# Patient Record
Sex: Male | Born: 1970
Health system: Southern US, Community
[De-identification: ages and names within clinical notes are randomized; demographics above are authoritative.]

## PROBLEM LIST (undated history)

## (undated) DIAGNOSIS — I10 Essential (primary) hypertension: Secondary | ICD-10-CM

## (undated) DIAGNOSIS — D170 Benign lipomatous neoplasm of skin and subcutaneous tissue of head, face and neck: Secondary | ICD-10-CM

## (undated) DIAGNOSIS — E119 Type 2 diabetes mellitus without complications: Secondary | ICD-10-CM

## (undated) DIAGNOSIS — T7840XA Allergy, unspecified, initial encounter: Secondary | ICD-10-CM

## (undated) DIAGNOSIS — F1011 Alcohol abuse, in remission: Secondary | ICD-10-CM

## (undated) DIAGNOSIS — K859 Acute pancreatitis without necrosis or infection, unspecified: Secondary | ICD-10-CM

## (undated) DIAGNOSIS — R1310 Dysphagia, unspecified: Secondary | ICD-10-CM

## (undated) DIAGNOSIS — Z720 Tobacco use: Secondary | ICD-10-CM

## (undated) DIAGNOSIS — H409 Unspecified glaucoma: Secondary | ICD-10-CM

## (undated) HISTORY — DX: Alcohol abuse, in remission: F10.11

## (undated) HISTORY — DX: Unspecified glaucoma: H40.9

## (undated) HISTORY — DX: Benign lipomatous neoplasm of skin and subcutaneous tissue of head, face and neck: D17.0

## (undated) HISTORY — DX: Tobacco use: Z72.0

## (undated) HISTORY — DX: Allergy, unspecified, initial encounter: T78.40XA

## (undated) HISTORY — DX: Dysphagia, unspecified: R13.10

## (undated) HISTORY — DX: Type 2 diabetes mellitus without complications: E11.9

## (undated) HISTORY — DX: Essential (primary) hypertension: I10

## (undated) HISTORY — DX: Acute pancreatitis without necrosis or infection, unspecified: K85.90

---

## 2005-04-18 ENCOUNTER — Inpatient Hospital Stay: Payer: Self-pay | Admitting: Internal Medicine

## 2006-08-09 ENCOUNTER — Inpatient Hospital Stay: Payer: Self-pay | Admitting: Internal Medicine

## 2006-11-15 ENCOUNTER — Inpatient Hospital Stay: Payer: Self-pay | Admitting: Internal Medicine

## 2006-11-15 ENCOUNTER — Other Ambulatory Visit: Payer: Self-pay

## 2009-04-03 ENCOUNTER — Emergency Department: Payer: Self-pay | Admitting: Emergency Medicine

## 2009-07-27 ENCOUNTER — Emergency Department: Payer: Self-pay | Admitting: Emergency Medicine

## 2009-11-17 ENCOUNTER — Inpatient Hospital Stay: Payer: Self-pay | Admitting: Internal Medicine
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
## 2011-08-01 ENCOUNTER — Inpatient Hospital Stay: Payer: Self-pay | Admitting: Internal Medicine

## 2012-05-22 ENCOUNTER — Inpatient Hospital Stay: Payer: Self-pay | Admitting: Internal Medicine

## 2012-05-22 LAB — URINALYSIS, COMPLETE
Bacteria: NONE SEEN
Bilirubin,UR: NEGATIVE
Glucose,UR: >=500 mg/dL (ref 0–75)
WBC UR: 1 /HPF (ref 0–5)

## 2012-05-22 LAB — COMPREHENSIVE METABOLIC PANEL
Albumin: 3.7 g/dL (ref 3.4–5.0)
Anion Gap: 11 (ref 7–16)
Bilirubin,Total: 1.9 mg/dL — ABNORMAL HIGH (ref 0.2–1.0)
Calcium, Total: 8.9 mg/dL (ref 8.5–10.1)
Chloride: 88 mmol/L — ABNORMAL LOW (ref 98–107)
Co2: 28 mmol/L (ref 21–32)
Creatinine: 1.52 mg/dL — ABNORMAL HIGH (ref 0.60–1.30)
EGFR (Non-African Amer.): 56 — ABNORMAL LOW
Glucose: 431 mg/dL — ABNORMAL HIGH (ref 65–99)
Osmolality: 275 (ref 275–301)
Potassium: 4.6 mmol/L (ref 3.5–5.1)
SGOT(AST): 89 U/L — ABNORMAL HIGH (ref 15–37)
Sodium: 127 mmol/L — ABNORMAL LOW (ref 136–145)

## 2012-05-22 LAB — CBC
HCT: 52.1 % — ABNORMAL HIGH (ref 40.0–52.0)
HGB: 17.9 g/dL (ref 13.0–18.0)
RBC: 5.13 10*6/uL (ref 4.40–5.90)
RDW: 13.5 % (ref 11.5–14.5)
WBC: 6.4 10*3/uL (ref 3.8–10.6)

## 2012-05-22 LAB — LIPASE, BLOOD: Lipase: 2115 U/L — ABNORMAL HIGH (ref 73–393)

## 2012-05-23 LAB — LIPID PANEL
Cholesterol: 205 mg/dL — ABNORMAL HIGH (ref 0–200)
HDL Cholesterol: 21 mg/dL — ABNORMAL LOW (ref 40–60)

## 2012-05-23 LAB — COMPREHENSIVE METABOLIC PANEL
Alkaline Phosphatase: 83 U/L (ref 50–136)
BUN: 20 mg/dL — ABNORMAL HIGH (ref 7–18)
Bilirubin,Total: 3.1 mg/dL — ABNORMAL HIGH (ref 0.2–1.0)
Co2: 20 mmol/L — ABNORMAL LOW (ref 21–32)
Creatinine: 1.77 mg/dL — ABNORMAL HIGH (ref 0.60–1.30)
EGFR (African American): 54 — ABNORMAL LOW
EGFR (Non-African Amer.): 46 — ABNORMAL LOW
SGOT(AST): 64 U/L — ABNORMAL HIGH (ref 15–37)
SGPT (ALT): 71 U/L
Total Protein: 7.7 g/dL (ref 6.4–8.2)

## 2012-05-23 LAB — BASIC METABOLIC PANEL
Anion Gap: 7 (ref 7–16)
BUN: 18 mg/dL (ref 7–18)
Calcium, Total: 6.4 mg/dL — CL (ref 8.5–10.1)
Chloride: 97 mmol/L — ABNORMAL LOW (ref 98–107)
Osmolality: 271 (ref 275–301)
Potassium: 4.5 mmol/L (ref 3.5–5.1)
Sodium: 129 mmol/L — ABNORMAL LOW (ref 136–145)

## 2012-05-23 LAB — HEMOGLOBIN A1C: Hemoglobin A1C: 6.8 % — ABNORMAL HIGH (ref 4.2–6.3)

## 2012-05-23 LAB — MAGNESIUM: Magnesium: 1.6 mg/dL — ABNORMAL LOW

## 2012-05-24 LAB — COMPREHENSIVE METABOLIC PANEL
Albumin: 2.3 g/dL — ABNORMAL LOW (ref 3.4–5.0)
Anion Gap: 7 (ref 7–16)
BUN: 13 mg/dL (ref 7–18)
Bilirubin,Total: 2.4 mg/dL — ABNORMAL HIGH (ref 0.2–1.0)
Creatinine: 1.38 mg/dL — ABNORMAL HIGH (ref 0.60–1.30)
Glucose: 172 mg/dL — ABNORMAL HIGH (ref 65–99)
Potassium: 3.9 mmol/L (ref 3.5–5.1)
SGOT(AST): 41 U/L — ABNORMAL HIGH (ref 15–37)
SGPT (ALT): 41 U/L
Sodium: 130 mmol/L — ABNORMAL LOW (ref 136–145)
Total Protein: 6.4 g/dL (ref 6.4–8.2)

## 2012-05-24 LAB — CBC WITH DIFFERENTIAL/PLATELET
Basophil %: 0.1 %
Eosinophil %: 0.5 %
HGB: 15.4 g/dL (ref 13.0–18.0)
Lymphocyte #: 0.6 10*3/uL — ABNORMAL LOW (ref 1.0–3.6)
MCH: 34.6 pg — ABNORMAL HIGH (ref 26.0–34.0)
MCHC: 33.3 g/dL (ref 32.0–36.0)
MCV: 104 fL — ABNORMAL HIGH (ref 80–100)
Monocyte #: 0.5 x10 3/mm (ref 0.2–1.0)
Monocyte %: 8.1 %
Neutrophil %: 82.8 %
Platelet: 94 10*3/uL — ABNORMAL LOW (ref 150–440)
RBC: 4.46 10*6/uL (ref 4.40–5.90)
WBC: 6.8 10*3/uL (ref 3.8–10.6)

## 2012-05-24 LAB — MAGNESIUM: Magnesium: 2 mg/dL

## 2012-05-24 LAB — LIPASE, BLOOD: Lipase: 1482 U/L — ABNORMAL HIGH (ref 73–393)

## 2012-05-25 LAB — COMPREHENSIVE METABOLIC PANEL
Alkaline Phosphatase: 65 U/L (ref 50–136)
BUN: 9 mg/dL (ref 7–18)
Bilirubin,Total: 1.9 mg/dL — ABNORMAL HIGH (ref 0.2–1.0)
Calcium, Total: 6.9 mg/dL — CL (ref 8.5–10.1)
Co2: 23 mmol/L (ref 21–32)
Creatinine: 0.99 mg/dL (ref 0.60–1.30)
EGFR (Non-African Amer.): >60
Glucose: 95 mg/dL (ref 65–99)
SGPT (ALT): 34 U/L
Sodium: 133 mmol/L — ABNORMAL LOW (ref 136–145)
Total Protein: 6.8 g/dL (ref 6.4–8.2)

## 2012-05-25 LAB — LIPASE, BLOOD: Lipase: 605 U/L — ABNORMAL HIGH (ref 73–393)

## 2012-05-25 LAB — MAGNESIUM: Magnesium: 2.5 mg/dL — ABNORMAL HIGH

## 2012-05-26 LAB — PRO B NATRIURETIC PEPTIDE: B-Type Natriuretic Peptide: 454 pg/mL — ABNORMAL HIGH (ref 0–125)

## 2012-05-26 LAB — BASIC METABOLIC PANEL
Anion Gap: 9 (ref 7–16)
Chloride: 100 mmol/L (ref 98–107)
Co2: 23 mmol/L (ref 21–32)
EGFR (Non-African Amer.): >60
Glucose: 129 mg/dL — ABNORMAL HIGH (ref 65–99)
Osmolality: 265 (ref 275–301)
Potassium: 3.6 mmol/L (ref 3.5–5.1)

## 2012-05-27 LAB — LIPASE, BLOOD: Lipase: 441 U/L — ABNORMAL HIGH (ref 73–393)

## 2012-05-27 LAB — BASIC METABOLIC PANEL
Anion Gap: 8 (ref 7–16)
Calcium, Total: 8.6 mg/dL (ref 8.5–10.1)
Chloride: 98 mmol/L (ref 98–107)
Co2: 27 mmol/L (ref 21–32)
Creatinine: 0.88 mg/dL (ref 0.60–1.30)
EGFR (African American): >60
Glucose: 149 mg/dL — ABNORMAL HIGH (ref 65–99)
Osmolality: 268 (ref 275–301)
Potassium: 3.4 mmol/L — ABNORMAL LOW (ref 3.5–5.1)
Sodium: 133 mmol/L — ABNORMAL LOW (ref 136–145)

## 2012-05-27 LAB — CBC WITH DIFFERENTIAL/PLATELET
Basophil #: 0 10*3/uL (ref 0.0–0.1)
Eosinophil #: 0 10*3/uL (ref 0.0–0.7)
Eosinophil %: 0.5 %
Lymphocyte #: 0.7 10*3/uL — ABNORMAL LOW (ref 1.0–3.6)
Lymphocyte %: 9.8 %
MCH: 34.8 pg — ABNORMAL HIGH (ref 26.0–34.0)
MCHC: 34.6 g/dL (ref 32.0–36.0)
MCV: 101 fL — ABNORMAL HIGH (ref 80–100)
Monocyte #: 1.5 x10 3/mm — ABNORMAL HIGH (ref 0.2–1.0)
Platelet: 129 10*3/uL — ABNORMAL LOW (ref 150–440)
RDW: 13.3 % (ref 11.5–14.5)

## 2013-05-30 ENCOUNTER — Ambulatory Visit: Payer: Self-pay | Admitting: Surgery

## 2013-05-30 LAB — CBC WITH DIFFERENTIAL/PLATELET
Basophil #: 0.1 10*3/uL (ref 0.0–0.1)
Basophil %: 1.4 %
Eosinophil #: 0.2 10*3/uL (ref 0.0–0.7)
Eosinophil %: 3.3 %
HCT: 43.3 % (ref 40.0–52.0)
HGB: 15.1 g/dL (ref 13.0–18.0)
Lymphocyte #: 2 10*3/uL (ref 1.0–3.6)
Lymphocyte %: 34.5 %
MCV: 89 fL (ref 80–100)
Neutrophil #: 3 10*3/uL (ref 1.4–6.5)
Neutrophil %: 52.1 %
Platelet: 196 10*3/uL (ref 150–440)
RBC: 4.89 10*6/uL (ref 4.40–5.90)
WBC: 5.7 10*3/uL (ref 3.8–10.6)

## 2013-05-30 LAB — COMPREHENSIVE METABOLIC PANEL
Albumin: 3.6 g/dL (ref 3.4–5.0)
Alkaline Phosphatase: 105 U/L (ref 50–136)
BUN: 13 mg/dL (ref 7–18)
Bilirubin,Total: 0.5 mg/dL (ref 0.2–1.0)
Calcium, Total: 9.5 mg/dL (ref 8.5–10.1)
Chloride: 96 mmol/L — ABNORMAL LOW (ref 98–107)
Creatinine: 1.3 mg/dL (ref 0.60–1.30)
EGFR (African American): >60
EGFR (Non-African Amer.): >60
SGPT (ALT): 24 U/L (ref 12–78)
Sodium: 133 mmol/L — ABNORMAL LOW (ref 136–145)

## 2013-08-13 IMAGING — US ABDOMEN ULTRASOUND
1 series · 14 of 25 positions shown · non-contrast
Comparison: none

REASON FOR EXAM: acute pancreatitis, h/o gallbladder sludge
COMMENTS:

[Series 1: abdomen ultrasound · 0.42mm/px · 14 of 96 slices shown]
[im 1/96]
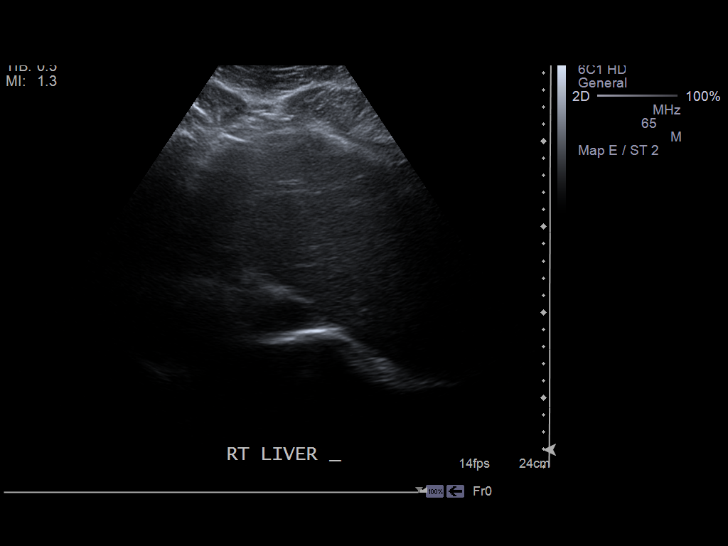
[im 8/96]
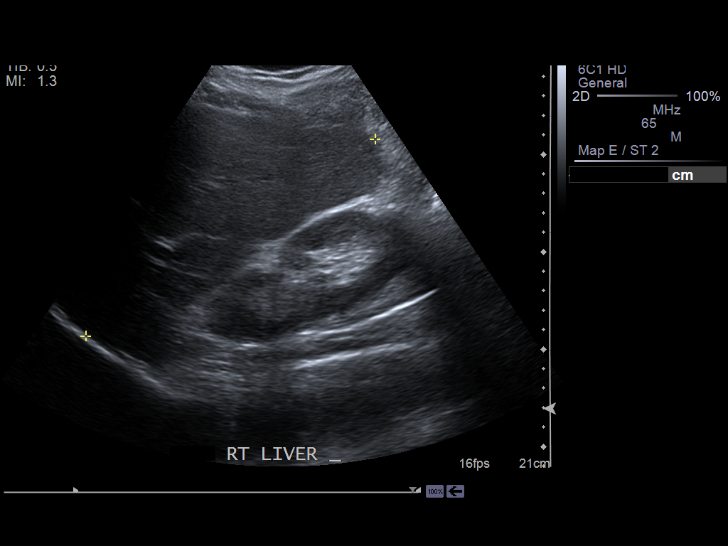
[im 16/96]
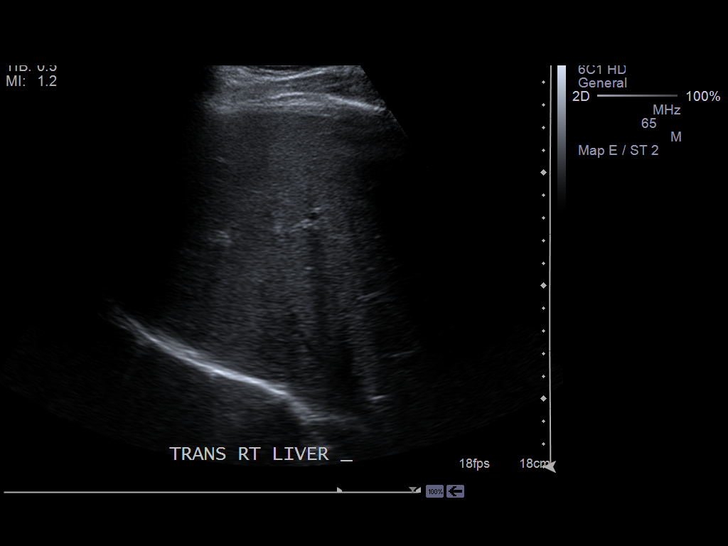
[im 24/96]
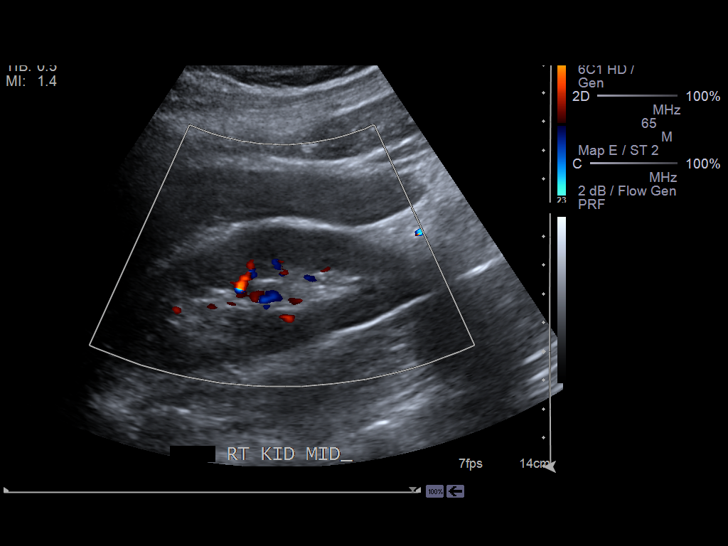
[im 32/96]
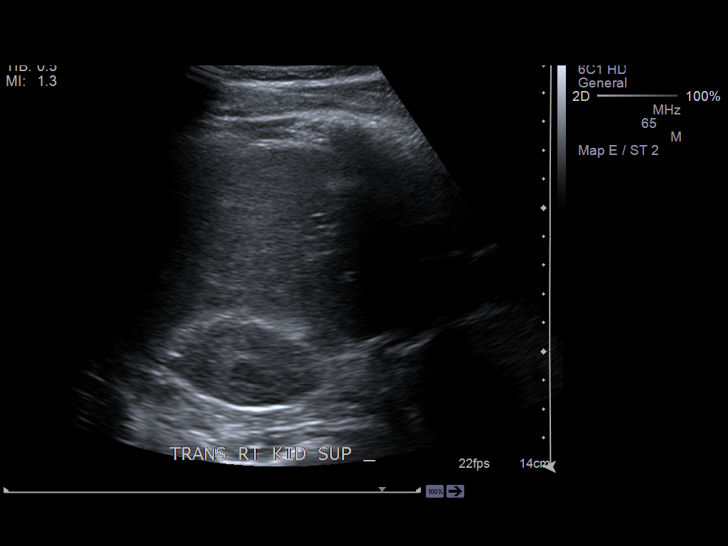
[im 36/96]
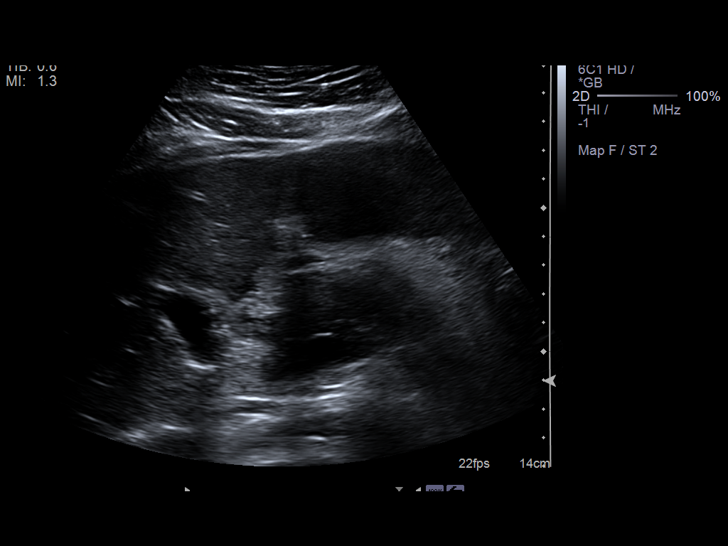
[im 44/96]
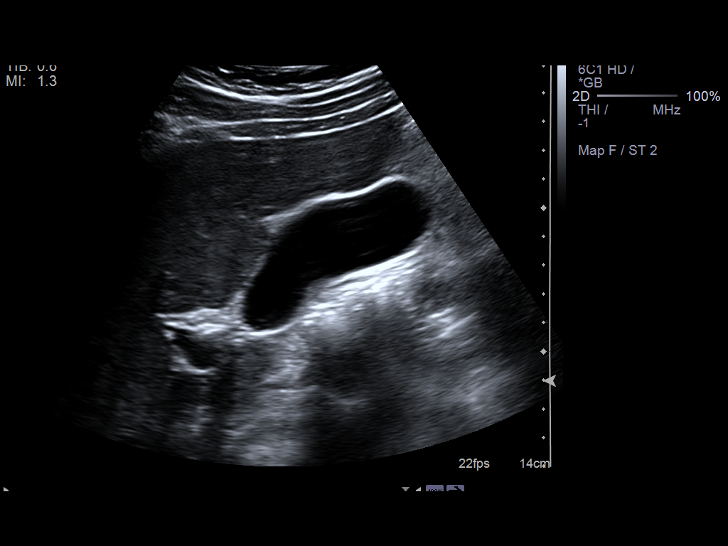
[im 52/96]
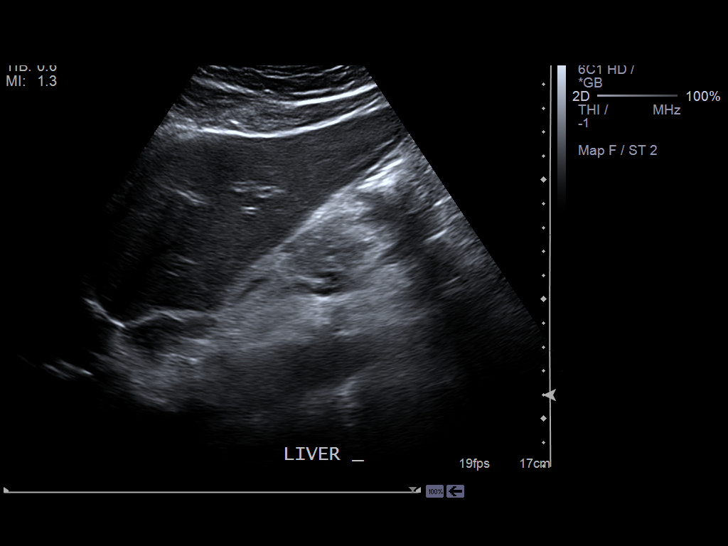
[im 60/96]
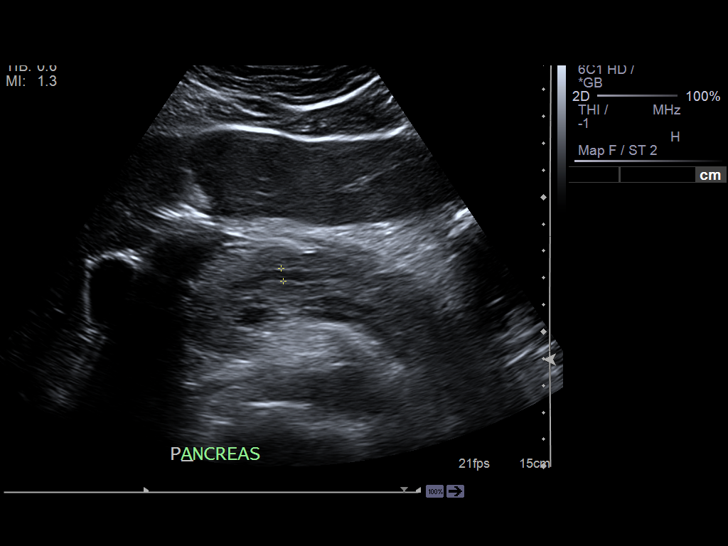
[im 64/96]
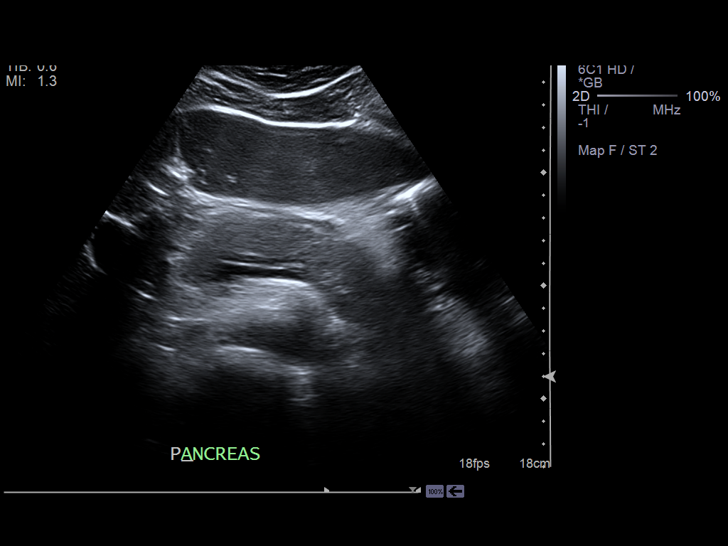
[im 72/96]
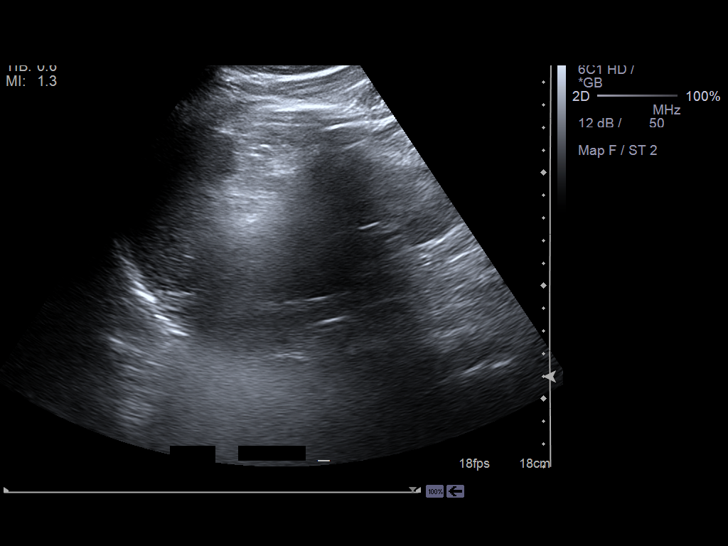
[im 80/96]
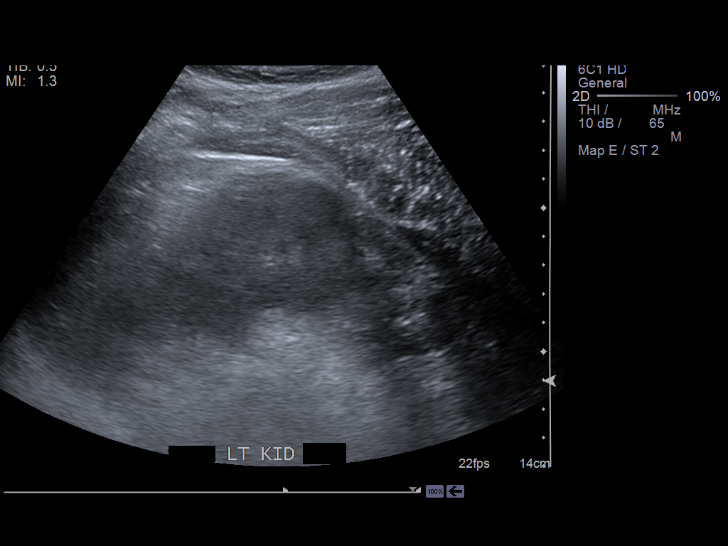
[im 88/96]
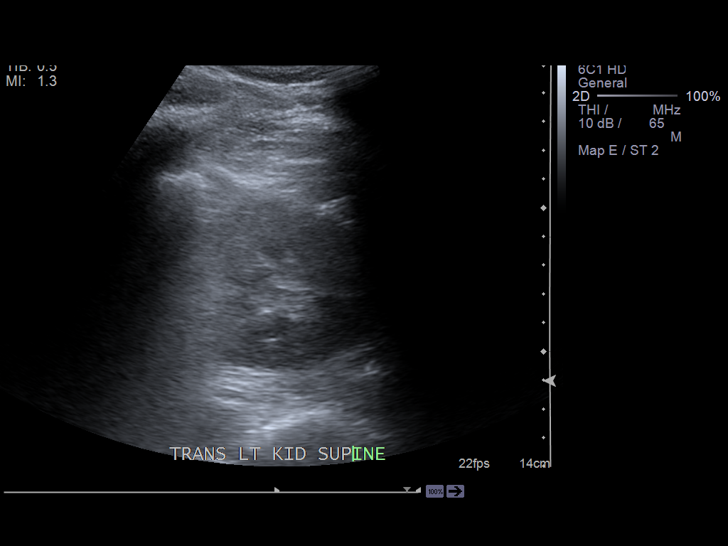
[im 96/96]
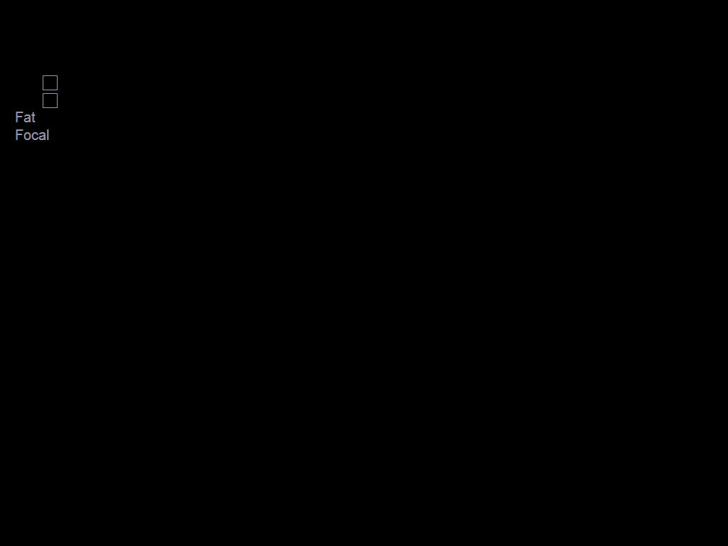

[14 of 25 positions shown; findings below may reference images not displayed]

PROCEDURE:     US  - US ABDOMEN GENERAL SURVEY  - May 22, 2012 [DATE]

RESULT:     The pancreas exhibits relatively normal contour and echotexture.
There is mild dilation of the pancreatic duct to approximately 5 mm. No
discrete pancreatic mass nor peripancreatic fluid is seen.

The liver exhibits normal echotexture with no focal mass nor ductal
dilation. Portal venous flow is normal in direction toward the liver. The
gallbladder is adequately distended with no evidence of stones, wall
thickening, or pericholecystic fluid. There is no positive sonographic
Murphy's sign. The common bile duct measures 3.7 mm in diameter. The spleen,
abdominal aorta, inferior vena cava, and kidneys are normal in appearance.
IMPRESSION: 1. There is mild pancreatic ductal dilation but no discrete pancreatic mass
or inflammatory changes seen. Mild pancreatic edema cannot be excluded.
2. The liver and gallbladder and spleen exhibit no acute abnormality.

## 2013-08-17 IMAGING — CR DG ABDOMEN 3V
1 series · 5 of 5 positions shown · non-contrast
Comparison: none

REASON FOR EXAM: abdominal distention,sob
COMMENTS:

PROCEDURE:     DXR - DXR ABDOMEN 3-WAY (INCL PA CXR)  - May 26, 2012 [DATE]
RESULT:     Comparisons:  None

[Series 1: pa · 0.17mm/px · 5 of 5 slices shown]
[im 1/5]
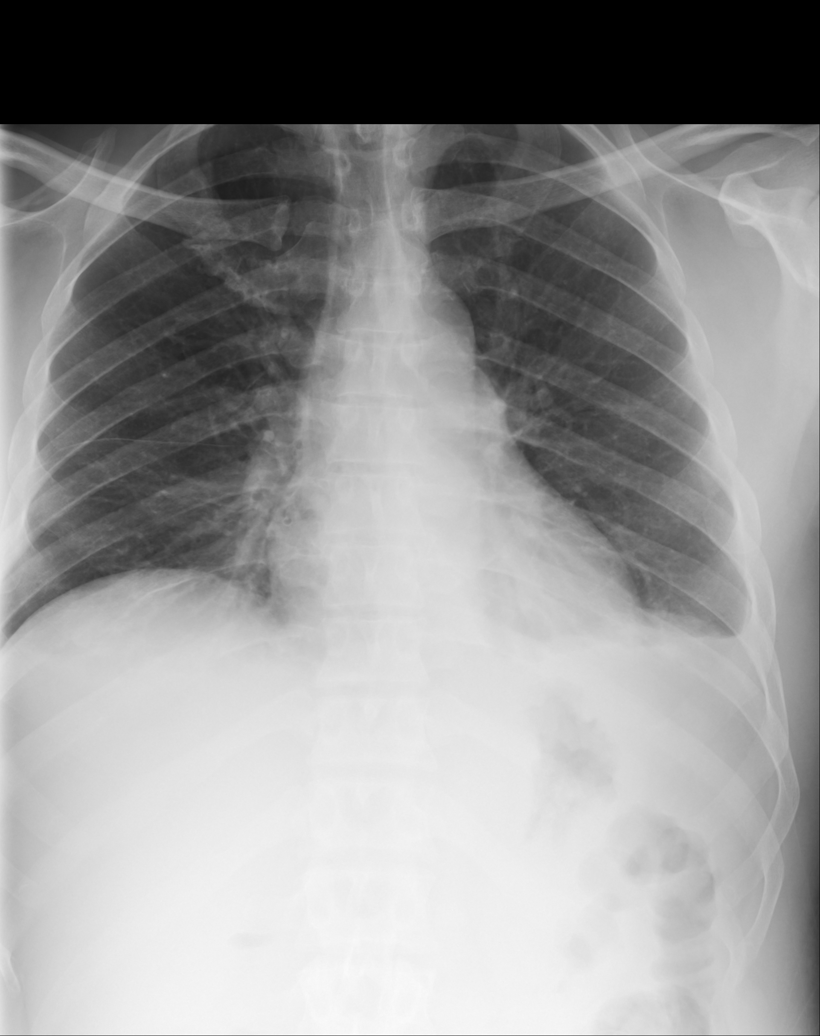
[im 2/5]
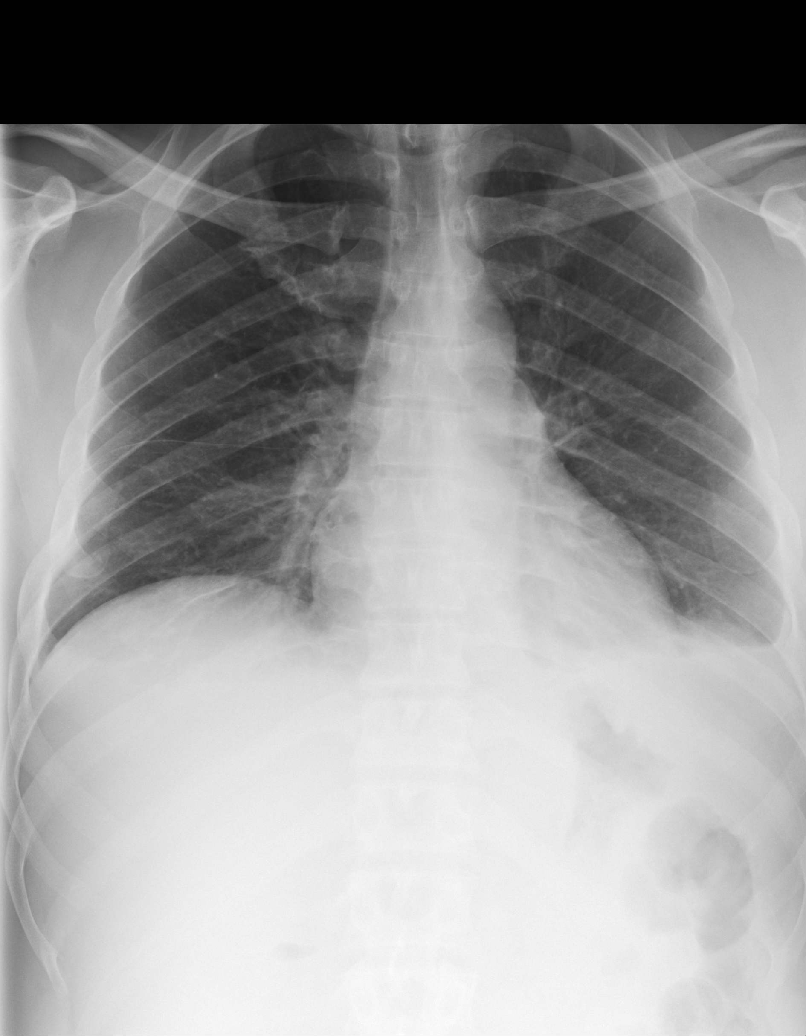
[im 3/5]
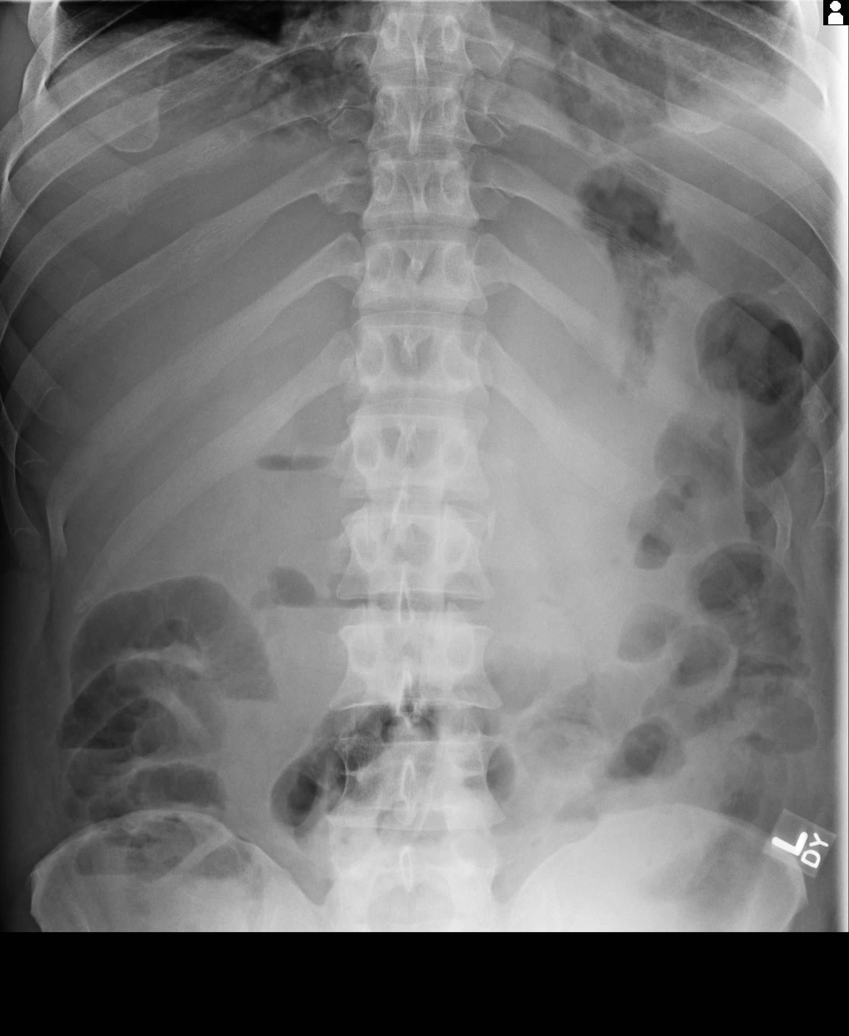
[im 4/5]
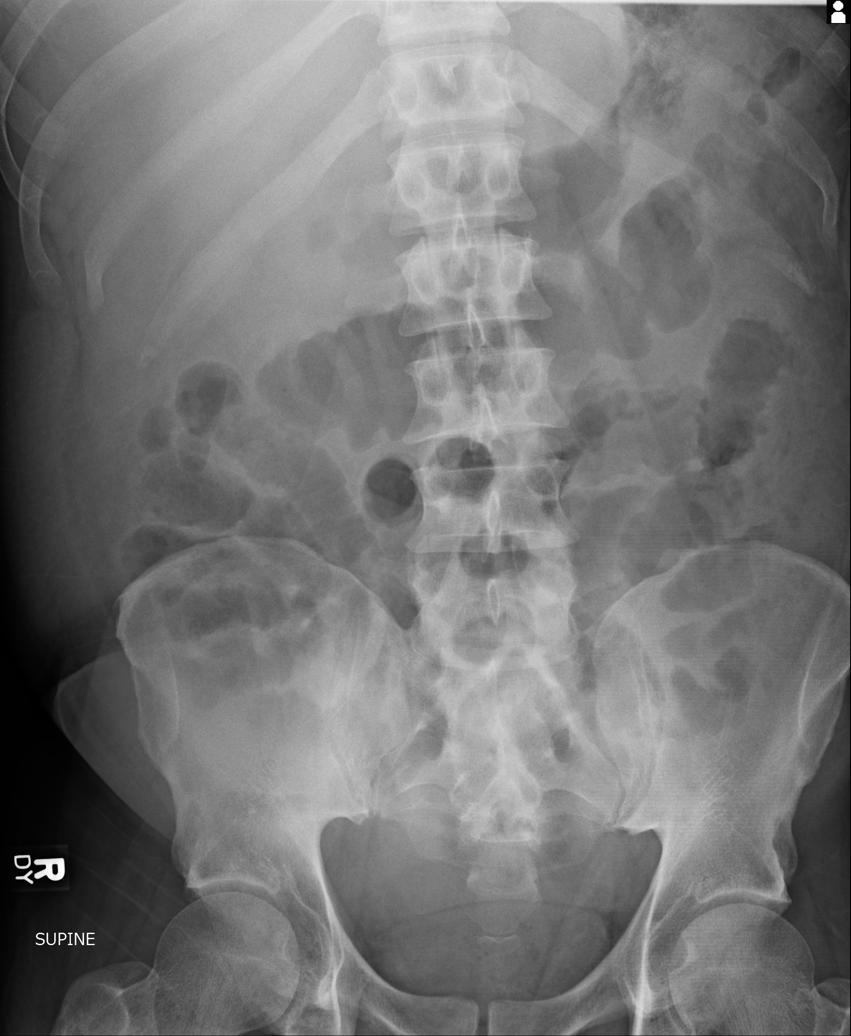
[im 5/5]
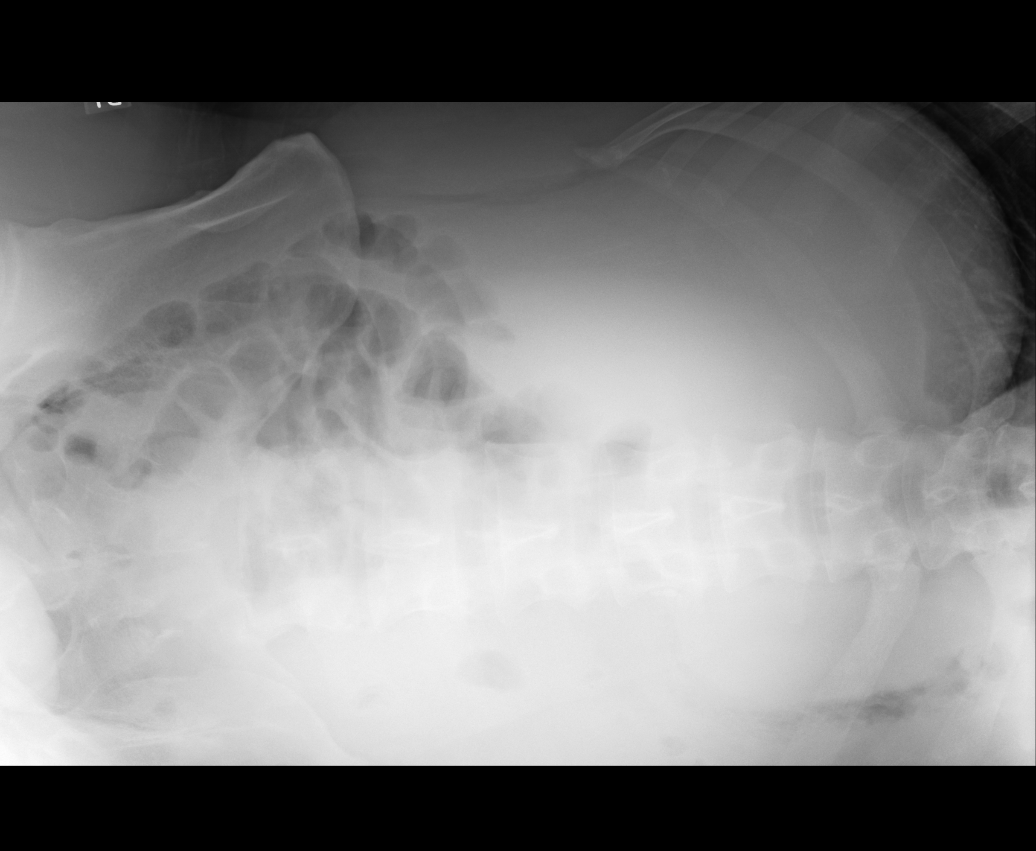

[5 of 5 positions shown; findings below may reference images not displayed]

FINDINGS: PA chest and supine, upright, and left lateral decubitus views of the
abdomen are provided.

There is a trace left pleural effusion. The heart and mediastinum are
unremarkable. The osseous structures are unremarkable.

There are a few small bowel air-fluid levels. There is no bowel dilatation.
Air is seen in the distal descending colon. There are no dilated loops,
bowel wall thickening, or evidence of mass effect.  Soft tissue shadows are
normal. There is no evidence of pneumoperitoneum, portal venous gas, or
pneumatosis.

Osseous structures are unremarkable.
IMPRESSION: There are a few small bowel air-fluid levels no bowel dilatation hand air
seen in the distal descending colon. This appearance can be seen with
enterocolitis secondary to an infectious or inflammatory etiology. Correlate
with clinical exam.

Trace left pleural effusion.

[REDACTED]

## 2015-03-15 NOTE — H&P (Signed)
PATIENT NAME:  Isaiah Stout, Isaiah Stout MR#:  [id number] DATE OF BIRTH:  18-Feb-1971  DATE OF ADMISSION:  05/22/2012  REFERRING PHYSICIAN: ER physician Dr. Prince Rome  PRIMARY CARE PHYSICIAN: [facility]  CHIEF COMPLAINT: Nausea, vomiting, abdominal pain.   HISTORY OF PRESENT ILLNESS: Patient is a 44 year old male with past medical history of alcohol-induced hepatitis, history of microlithiasis and gallstone sludge, diabetes, hypertension, hyperlipidemia not on any medications, ongoing alcohol and smoking who presented with sudden onset of nausea, vomiting and abdominal pain which started yesterday at about 5:00 p.m. The abdominal pain was initially generalized but is now more localized in the epigastric area. Patient denies any fever or diarrhea or constipation. He denies any cough, chest pain, shortness of breath. Patient was last admitted to [facility] in September 2012 with similar issues and at that time signed himself out [place].   ALLERGIES: No known drug allergies.   CURRENT MEDICATIONS: Lantus 60 units daily. Patient reports that he has not taken his Lantus because he ran out of the medication about two days ago.   PAST SURGICAL HISTORY: None.   PAST MEDICAL HISTORY:  1. History of recurrent alcoholic pancreatitis. Patient had an abdominal ultrasound in September 2012 which did show some gallbladder sludge. He was evaluated by Dr. Gustavo Lah at that time who felt that microlithiasis and gallbladder component may also be present in relation to his pancreatitis. Patient left AGAINST MEDICAL ADVICE before he could be evaluated further. 2. Elevated LFTs due to alcoholic hepatitis. 3. History of acute alcohol abuse.  4. History of acute renal failure, prerenal. 5. Diabetes. 6. History of dyslipidemia with elevated triglycerides. 7. History of hypertension. 8. Thrombocytopenia. 9. Ongoing tobacco and alcohol use. Patient reports that he last drank about four days ago.   FAMILY HISTORY:  Mother and father had diabetes.   SOCIAL HISTORY: Patient reports that he smokes 1 pack per day for the past several years. Patient reports that he drinks one 24-ounce beer every day. On the weekends he drinks a six-pack, 24 ounces beer and 4 to 5 shots of hard liquor. His last drink was about four days ago. He denies ever going into alcohol withdrawal.    REVIEW OF SYSTEMS: CONSTITUTIONAL: Denies any fever, fatigue, weakness. EYES: Denies any blurred or double vision. ENT: Denies any tinnitus, ear pain. RESPIRATORY: Denies any cough, wheezing. CARDIOVASCULAR: Denies any chest pain, palpitations. GASTROINTESTINAL: Reports nausea, vomiting, abdominal pain. Denies any diarrhea, constipation. GENITOURINARY: Denies any dysuria, hematuria. ENDOCRINE: Denies any polyuria, nocturia. HEME/LYMPH: Denies any anemia, easy bruisability. INTEGUMENT: Denies any acne, rash. MUSCULOSKELETAL: Denies any swelling, gout. NEUROLOGICAL: Denies any numbness, weakness. PSYCH: Denies any anxiety, depression.   PHYSICAL EXAMINATION:  VITAL SIGNS: Temperature 97.3, heart rate 68, respiratory rate 20, blood pressure 143/98, pulse ox 100%.   GENERAL: Patient is a 44 year old African American male laying comfortably in bed, not in acute distress. He is requesting for pain medications.   HEAD: Atraumatic, normocephalic.   EYES: There is no pallor, icterus, or cyanosis. Pupils equal, round, and reactive to light and accommodation. Extraocular movements intact.   ENT: Wet mucous membranes. No oropharyngeal erythema or thrush.   NECK: Supple. No masses. No JVD. No thyromegaly or lymphadenopathy.   CHEST WALL: No tenderness to palpation. Not using accessory muscles of respiration. No intercostal muscle retractions.   LUNGS: Bilaterally clear to auscultation. No wheezing, rales, or rhonchi.   CARDIOVASCULAR: S1, S2 regular. No murmur, rubs, or gallops.   ABDOMEN: Patient has generalized abdominal tenderness  more so in the  epigastric region. Hyperactive bowel sounds. No organomegaly. No guarding. No rigidity.   SKIN: No rashes or lesions.   PERIPHERIES: No pedal edema. 2+ pedal pulses.   MUSCULOSKELETAL: No cyanosis or clubbing.   NEUROLOGIC: Awake, alert, oriented x3. Nonfocal neurological exam. Cranial nerves grossly intact. No evidence of tremors.   PSYCH: Normal mood and affect.   LABORATORY, DIAGNOSTIC, AND RADIOLOGICAL DATA: Glucose 431, BUN 15, creatinine 1.52, sodium 127, potassium 4.6, chloride 8.88, CO2 28, calcium 8.9, bilirubin 1.9, alkaline phosphatase 92, ALT 142, white count 6.4, hemoglobin 17.9, hematocrit 52.1, platelet count 214, lipase 2115, amylase 186. Urinalysis shows evidence of infection.   ASSESSMENT AND PLAN: 44 year old male with past medical history of alcohol abuse, smoking, diabetes presents with nausea, vomiting, abdominal pain.  1. Acute pancreatitis, possibly related to patient's ongoing alcohol abuse. He has been admitted in the past for alcohol-induced pancreatitis. Abdominal ultrasound in 07/2011 also showed gallbladder sludge. Will admit the patient to the hospital. Keep him n.p.o. except medications and ice chips. Start him on IV fluid hydration, p.r.n. antiemetics and analgesics. Will recheck a lipase level in a.m. Will also check an abdominal ultrasound. Will check fasting lipid profile since patient has a history of hypertriglyceridemia in the past but is not on any treatment.   2. Uncontrolled diabetes. Patient reports that he ran out of his Lantus about 2 to 3 days ago. Will restart him on Lantus and place him on insulin sliding scale.  3. Acute renal failure, possibly due to poor oral intake. Will hydrate with IV fluids. Monitor strict ins and outs. Avoid nephrotoxic medications.  4. Hyponatremia possibly due to dehydration. There is also a possible component of beer potomania. Will hydrate patient with IV fluids. Monitor his sodium level closely.  5. Elevated LFTs with  elevated bilirubin, alkaline phosphatase and AST likely due to alcohol abuse. Will monitor closely.  6. History of hypertension. Patient is currently not on any medications. His blood pressure is mildly elevated currently possibly due to acute distress from pain. Will monitor closely and start on medications as needed to achieve good hypertensive control.  7. History of thrombocytopenia. Patient's platelet count is currently stable. 8. Smoking. Patient has been counseled about cessation for more than three minutes. Will provide a nicotine patch.  9. History of alcohol abuse. Patient reports that his last drink was more than four days ago. His alcohol level is not detectable on serum alcohol level. Will watch for alcohol withdrawal.   Reviewed old medical records, discussed with the patient and his wife the plan of care and management.   TIME SPENT: 75 minutes.   ____________________________ Cherre Huger, MD sp:cms D: 05/22/2012 11:08:56 ET T: 05/22/2012 11:49:06 ET JOB#: [id number]  cc: Cherre Huger, MD, <Dictator> [facility] Cherre Huger MD ELECTRONICALLY SIGNED 05/23/2012 7:09

## 2015-03-15 NOTE — Consult Note (Signed)
Pt feeling better, still with bloating, much less pain, had BM and passed gas today, did OK on clear liq this morning.  Lipase down to 600 from 3000 on admission.  I told him if he drinks alcohol he will be back with same problem.  Usually alcoholics have trouble sleeping so i recommend something to help with that when he leaves the hospital, may need some pain reliever such as tramadol or vicodin.  No new suggestions.  Dr. Candace Cruise on call for me over weekend but will not see unless you call about a problem.  Electronic Signatures: Manya Silvas (MD)  (Signed on 05-Jul-13 12:16)  Authored  Last Updated: 05-Jul-13 12:16 by Manya Silvas (MD)

## 2015-03-15 NOTE — Consult Note (Signed)
PATIENT NAME:  Isaiah Stout, Isaiah Stout MR#:  [id number] DATE OF BIRTH:  1971-04-09  DATE OF CONSULTATION:  05/23/2012  REFERRING PHYSICIAN:   CONSULTING PHYSICIAN:  Manya Silvas, MD  HISTORY OF PRESENT ILLNESS: Patient is a 44 year old black male who has a history of alcohol-induced pancreatitis. He had been admitted for this problem before and left AGAINST MEDICAL ADVICE after a few days. He currently comes in with epigastric abdominal pain across the abdomen radiating some into the back. He did have some vomiting and heaves. Denied any hematemesis. Patient has been plugged into IV fluids with hydration and morphine for pain. He says the morphine is controlling his pain. I was asked to see him in consultation.   Patient had an ultrasound and lab work raising suspicion for possible gallstone contributing to his problem. He had an M.R.C.P. today that showed no evidence of common bile duct obstruction from any stones or tumors. There was fullness of the pancreas consistent with acute pancreatitis.   Patient admits to smoking a pack a day. He drinks beer during the week and on the weekend beer and hard liquor.   Patient has a history of diabetes, insulin-dependent diabetes, for about six years. He thinks his first spell of pancreatitis occurred three or four years ago.   MEDICATION:  Lantus insulin 60 units a day. He had been out of his insulin for a few days.   ALLERGIES: No known drug allergies.   FAMILY HISTORY: Both parents died of lung disease; father at age 86, mother at age 50, both were smokers.   OTHER PAST MEDICAL PROBLEMS:  1. History of alcohol pancreatitis.  2. History of dyslipidemia with elevated triglycerides.  3. Hypertension.  4. Renal insufficiency.  5. Diabetes.  REVIEW OF SYSTEMS: Denies any hematemesis. No diarrhea. No rectal bleeding. No chest pains. He does have some trouble taking a deep breath because of discomfort in his abdomen.   PHYSICAL EXAMINATION:  GENERAL:  Black male in no acute distress, appropriate historian.   VITAL SIGNS: Temperature 98.7, pulse 128, blood pressure 166/78, pulse oximetry 95%.   EYES: Sclerae nonicteric. Conjunctivae negative.   HEAD: Atraumatic.   CHEST: Clear.   HEART: Tachycardia. No murmurs or gallops I can hear.   ABDOMEN: Distended and tight. Bowel sounds are present but diminished. No hepatosplenomegaly. No masses. No bruits but it is difficult to tell for sure because of his tight distention. There is tenderness across the upper abdomen, worse in the epigastric area.   LABORATORY, DIAGNOSTIC AND RADIOLOGICAL DATA: Glucose 278, BUN 18, creatinine 1.65, sodium 129, potassium 4.5, chloride 97, CO2 25, calcium 6.4, magnesium 1.6, triglycerides 814, HDL 21, lipase greater than 3000, hemoglobin A1c 6.8, total protein 7.7, albumin 2.8, total bilirubin 3.1, alkaline phosphatase 83, SGOT 64, SGPT 71, white count 6.4, hemoglobin 17.9 on admission, no repeat today yet, hematocrit 52.1, platelet count 214. Urinalysis significant for elevated glucose and protein.   ASSESSMENT: Alcohol pancreatitis. No evidence of biliary stone contribution.   RECOMMENDATIONS: IV fluids gently up to 4 or 5 liters in the first 24 hours. Continue morphine for pain relief. Insulin for his diabetes. Consider antilipid therapy if patient will take it as an outpatient. I talked with the patient about alcohol affecting the pancreas and about pancreatic enzymes causing pain and difficulty breathing. Will recommend he be started on incentive spirometer to prevent atelectasis and possible pneumonia.  ____________________________ Manya Silvas, MD rte:cms D: 05/23/2012 16:54:40 ET T: 05/23/2012 17:22:49 ET  JOB#: [id number]  cc: Manya Silvas, MD, <Dictator> Manya Silvas MD ELECTRONICALLY SIGNED 06/05/2012 17:33

## 2015-03-15 NOTE — Discharge Summary (Signed)
PATIENT NAME:  Isaiah Stout, Isaiah Stout MR#:  [id number] DATE OF BIRTH:  February 25, 1971  DATE OF ADMISSION:  05/22/2012 DATE OF DISCHARGE:  05/27/2012  ADMITTING DIAGNOSIS: Abdominal pain, nausea, vomiting.   DISCHARGE DIAGNOSES:  1. Nausea, vomiting, abdominal pain due to acute pancreatitis.  2. Acute pancreatitis felt to be due to hypertriglyceridemia as well as alcohol abuse. Lipase is still slightly elevated. The patient is without any abdominal symptoms.  3. Abdominal distention with nonspecific gas pattern, likely ileus related to acute pancreatitis.  4. Hypertriglyceridemia. The patient will be treated with TriCor.  5. Alcohol abuse. The patient was counseled regarding importance of stopping to drink.  6. Hypocalcemia possibly due to acute electrolyte imbalances, status post treatment with calcium chloride.  7. Possible sepsis with acute renal failure with acidosis. There was no evidence of infection and was felt to be due to pancreatitis. He was on antibiotics which were discontinued.  8. Hyperkalemia felt to be due to acute renal failure, resolved. Subsequently was hypokalemic which was replaced.  9. Poorly controlled diabetes.  10. Acute renal failure due to nausea and vomiting. Renal function normal.  11. Hyponatremia due to dehydration.  12. Elevated LFTs with elevated bilirubin likely due to alcohol abuse.  13. Hypertension.  14. History of thrombocytopenia.  15. Ongoing tobacco abuse.   PERTINENT LABS AND EVALUATIONS: Glucose 431, BUN 15, creatinine 1.52, sodium 127, potassium 4.6, CO2 28, calcium 8.9, bilirubin total 1.9, alkaline phosphatase 92, ALT 142, WBC count 6.4, hemoglobin 17.9, platelet count 214. Lipase 2115. Amylase 186. Urinalysis was negative. Lipid panel showed cholesterol of 205, triglycerides 814, HDL 21. His hemoglobin A1c was 6.8. His most recent creatinine was normal on July 5th.   Abdominal x-ray showed diffuse small bowel air fluid levels. No bowel dilation.  MRA  showed unremarkable MRCP. Enlargement of the head and uncinate process of the pancreas likely secondary to pancreatitis.   CONSULTANT: GI, Dr. Lamar Benes COURSE: The patient is a 44 year old African American male with past medical history of alcohol-induced hepatitis, history of gallstone sludge, diabetes, hypertension, and hyperlipidemia not on any medications. He was taking insulin but states that he ran out of the insulin, went to the clinic and was not able to pick that up. He presented with abdominal pain. He also has ongoing alcohol abuse. He was noted to be in acute pancreatitis. It was felt to be likely due to combination of his alcohol abuse and hypertriglyceridemia. The patient was kept n.p.o. He was seen by GI. Conservative management was done with resolution of his symptoms, although his lipase continues to be slightly elevated possibly due to chronic pancreatitis. The patient was also seen by GI. Due to his previous history of gallbladder sludge, he had a repeat ultrasound of the abdomen. He also underwent MRCP which showed no evidence of common bile duct obstruction. The patient also was noted to have acute renal failure that resolved with treatment. His diabetes is better controlled with the current insulin. His blood pressure was also poorly controlled. He was started on atenolol. Currently he is doing much better and is stable for discharge.   DISCHARGE MEDICATIONS:  1. Lantus 60 units sub-Q at bedtime.  2. TriCor 145 p.o. daily.  3. Atenolol 100 p.o. daily.   DIET: ADA, low fat diet.   ACTIVITY: As tolerated.   TIMEFRAME FOR FOLLOW-UP: 1 to 2 weeks at [facility].   The patient is told he needs to take his insulin as scheduled and meds  as prescribed. He is recommended not to drink alcohol. The patient will be supplied with seven day supply of medications from the hospital.       TIME SPENT: 35 minutes.   ____________________________ Lafonda Mosses Posey Pronto,  MD shp:drc D: 05/27/2012 12:17:22 ET T: 05/29/2012 10:11:42 ET JOB#: [id number]  cc: Ellee Wawrzyniak H. Posey Pronto, MD, <Dictator> [facility] Alric Seton MD ELECTRONICALLY SIGNED 06/01/2012 17:50

## 2015-03-15 NOTE — Consult Note (Signed)
Chief Complaint:   Subjective/Chief Complaint Feels better but still c/o abdominal distension and some discomfort. No vomiting. No BM and has not passed gas.   VITAL SIGNS/ANCILLARY NOTES: **Vital Signs.:   04-Jul-13 08:45   Vital Signs Type Routine   Temperature Temperature (F) 99.2   Celsius 37.3   Pulse Pulse 107   Respirations Respirations 18   Systolic BP Systolic BP 782   Diastolic BP (mmHg) Diastolic BP (mmHg) 89   Mean BP 106   Pulse Ox % Pulse Ox % 94   Pulse Ox Activity Level  At rest   Oxygen Delivery Room Air/ 21 %   Brief Assessment:   Additional Physical Exam Abdomen is distended and somewhat tense. Bowel sounds are very sluggish.   Lab Results: Hepatic:  04-Jul-13 04:49    Bilirubin, Total  2.4   Alkaline Phosphatase 62   SGPT (ALT) 41 (12-78 NOTE: NEW REFERENCE RANGE 10/14/2011)   SGOT (AST)  41   Total Protein, Serum 6.4   Albumin, Serum  2.3  Routine Chem:  04-Jul-13 04:49    Glucose, Serum  172   BUN 13   Creatinine (comp)  1.38   Sodium, Serum  130   Potassium, Serum 3.9   Chloride, Serum  97   CO2, Serum 26   Calcium (Total), Serum  6.7   Osmolality (calc) 265   eGFR (African American) >60   eGFR (Non-African American) >60 (eGFR values <79m/min/1.73 m2 may be an indication of chronic kidney disease (CKD). Calculated eGFR is useful in patients with stable renal function. The eGFR calculation will not be reliable in acutely ill patients when serum creatinine is changing rapidly. It is not useful in  patients on dialysis. The eGFR calculation may not be applicable to patients at the low and high extremes of body sizes, pregnant women, and vegetarians.)   Result Comment calcium - RESULTS VERIFIED BY REPEAT TESTING.  - NOTIFIED OF CRITICAL VALUE  - mpg c/ silvia fuentes @ 0617 05/24/12  - READ-BACK PROCESS PERFORMED.  Result(s) reported on 24 May 2012 at 06:01AM.   Anion Gap 7   Magnesium, Serum 2.0 (1.8-2.4 THERAPEUTIC RANGE: 4-7  mg/dL TOXIC: > 10 mg/dL  -----------------------)   Lipase  1482 (Result(s) reported on 24 May 2012 at 06:15AM.)  Routine Hem:  04-Jul-13 04:49    WBC (CBC) 6.8   RBC (CBC) 4.46   Hemoglobin (CBC) 15.4   Hematocrit (CBC) 46.3   Platelet Count (CBC)  94   MCV  104   MCH  34.6   MCHC 33.3   RDW 13.7   Neutrophil % 82.8   Lymphocyte % 8.5   Monocyte % 8.1   Eosinophil % 0.5   Basophil % 0.1   Neutrophil # 5.6   Lymphocyte #  0.6   Monocyte # 0.5   Eosinophil # 0.0   Basophil # 0.0 (Result(s) reported on 24 May 2012 at 06:01AM.)   Assessment/Plan:  Assessment/Plan:   Assessment Acute pancreatitis. Lipase is now around 1400 (was > 3000). Probable ileus. No evidence of choledocholithiasis. Bilirubin is improving.    Plan Continue supportive care. I will not start clear liquids now due to suspected ileus.  Sit in chair frequently and walk if possible. Dr. EVira Agarwill resume care in am.   Electronic Signatures: IJill Side(MD)  (Signed 04-Jul-13 10:59)  Authored: Chief Complaint, VITAL SIGNS/ANCILLARY NOTES, Brief Assessment, Lab Results, Assessment/Plan   Last Updated: 04-Jul-13 10:59 by IJill Side(MD)

## 2015-03-15 NOTE — Consult Note (Signed)
Pt with alcohol pancreatitis, MRCP showed no sign of CBD blockage.  Will start incentive spirometer to try to prevent atelectasis.  Pt wants to try to get out of hospital so can go back to work Monday.  Electronic Signatures: Manya Silvas (MD)  (Signed on 03-Jul-13 16:56)  Authored  Last Updated: 03-Jul-13 16:56 by Manya Silvas (MD)

## 2015-04-13 ENCOUNTER — Other Ambulatory Visit: Payer: Self-pay | Admitting: Family Medicine

## 2015-04-13 ENCOUNTER — Ambulatory Visit
Admission: RE | Admit: 2015-04-13 | Discharge: 2015-04-13 | Disposition: A | Discharge status: Home / Self Care | Payer: BLUE CROSS/BLUE SHIELD | Source: Ambulatory Visit | Attending: Family Medicine | Admitting: Family Medicine

## 2015-04-13 DIAGNOSIS — M545 Low back pain: Secondary | ICD-10-CM | POA: Diagnosis present

## 2015-04-13 DIAGNOSIS — M79606 Pain in leg, unspecified: Secondary | ICD-10-CM | POA: Diagnosis present

## 2015-04-13 DIAGNOSIS — M79605 Pain in left leg: Secondary | ICD-10-CM | POA: Insufficient documentation

## 2015-05-09 ENCOUNTER — Other Ambulatory Visit: Payer: Self-pay | Admitting: Family Medicine

## 2015-05-21 ENCOUNTER — Telehealth: Payer: Self-pay | Admitting: Family Medicine

## 2015-05-21 NOTE — Telephone Encounter (Signed)
Patient is checking status on refill for Omeprazole. Please send to [email or web address]. It is needing authorization.

## 2015-05-23 ENCOUNTER — Other Ambulatory Visit: Payer: Self-pay | Admitting: Family Medicine

## 2015-05-27 ENCOUNTER — Other Ambulatory Visit: Payer: Self-pay | Admitting: Family Medicine

## 2015-06-04 NOTE — Telephone Encounter (Signed)
Tried to place prior auth for medication on [email or web address], stated no coverage, called [employer] and they're updating their computer system therefore I'll have to call back in an hr or two.

## 2015-06-09 NOTE — Telephone Encounter (Signed)
Called [employer] this morning and after being on the phone for 40 mins, I finally spoke with someone who faxed a prior auth form to me to be filled waiting out, waiting on signature from Dr. Then will fax to [facility] to determine if they'll approve or deny the medication.

## 2015-06-10 NOTE — Telephone Encounter (Signed)
I have faxed the prior auth for omeprazole to [employer], waiting on a determination. Once I have receive that I'll notify the patient of the outcome

## 2015-07-08 ENCOUNTER — Other Ambulatory Visit: Payer: Self-pay | Admitting: Family Medicine

## 2015-08-08 ENCOUNTER — Other Ambulatory Visit: Payer: Self-pay | Admitting: Family Medicine

## 2015-08-17 ENCOUNTER — Ambulatory Visit: Payer: Self-pay | Admitting: Family Medicine

## 2015-08-26 ENCOUNTER — Ambulatory Visit: Payer: Self-pay | Admitting: Family Medicine

## 2015-09-02 ENCOUNTER — Ambulatory Visit (INDEPENDENT_AMBULATORY_CARE_PROVIDER_SITE_OTHER): Payer: 59 | Admitting: Family Medicine

## 2015-09-02 ENCOUNTER — Encounter: Payer: Self-pay | Admitting: Family Medicine

## 2015-09-02 VITALS — BP 132/80 | HR 67 | Temp 97.6°F | Resp 16 | Ht 71.0 in | Wt 186.5 lb

## 2015-09-02 DIAGNOSIS — E109 Type 1 diabetes mellitus without complications: Secondary | ICD-10-CM | POA: Diagnosis not present

## 2015-09-02 DIAGNOSIS — I10 Essential (primary) hypertension: Secondary | ICD-10-CM

## 2015-09-02 DIAGNOSIS — Z125 Encounter for screening for malignant neoplasm of prostate: Secondary | ICD-10-CM

## 2015-09-02 DIAGNOSIS — IMO0001 Reserved for inherently not codable concepts without codable children: Secondary | ICD-10-CM

## 2015-09-02 DIAGNOSIS — Z23 Encounter for immunization: Secondary | ICD-10-CM | POA: Diagnosis not present

## 2015-09-02 DIAGNOSIS — E1065 Type 1 diabetes mellitus with hyperglycemia: Principal | ICD-10-CM

## 2015-09-02 DIAGNOSIS — E785 Hyperlipidemia, unspecified: Secondary | ICD-10-CM | POA: Diagnosis not present

## 2015-09-02 LAB — POCT GLYCOSYLATED HEMOGLOBIN (HGB A1C): Hemoglobin A1C: 12.5

## 2015-09-02 LAB — POCT UA - MICROALBUMIN: Microalbumin Ur, POC: NEGATIVE mg/L

## 2015-09-02 NOTE — Patient Instructions (Signed)
Referred to endocrinologist. 

## 2015-09-02 NOTE — Progress Notes (Signed)
4 6460 mg Name: Isaiah Stout.   MRN: [id number]    DOB: Aug 06, 1971   Date:09/02/2015       Progress Note  Subjective  Chief Complaint  Chief Complaint  Patient presents with  . Diabetes    Patient is here for a 60-month follow-up. Patient states that things have been going well. He checks his blood sugar once a day. The highest it has been is 220 and the lowest has been 153.  . Medication Refill    Patient takes all medications as directed.    HPI  Diabetes  Patient presents for follow-up of diabetes which is present for over 5 years. Is currently on a regimen of Levemir 46 units twice a day and Apidra 16 units 3 times a day . Patient states he is compliant with their diet and exercise. There's been no hypoglycemic episodes and there is no polyuria polydipsia polyphagia. His average fasting glucoses been in the low around 153   with a high around 220 . There is no end organ disease.  Last diabetic eye exam was over 1 year ago.   Last visit with dietitian was over 1 year ago. Last microalbumin was obtained 0 on 04/04/2015 .   Hypertension   Patient presents for follow-up of hypertension. It has been present for over over 5 years.  Patient states that there is compliance with medical regimen which consists of diltiazem 180 mg daily . There is no end organ disease. Cardiac risk factors include hypertension hyperlipidemia and diabetes.  Exercise regimen consist of walking .  Diet consist of ADA .  Hyperlipidemia  Patient has a history of hyperlipidemia for over 5 years.  Current medical regimen consist of atorvastatin 40 mg daily at bedtime .  Compliance is good .  Diet and exercise are currently followed well, uncontrolled diabetes .  Risk factors for cardiovascular disease include hyperlipidemia , tobacco abuse uncontrolled diabetes .   There have been no side effects from the medication.    Past Medical History  Diagnosis Date  . Pancreatitis   . Diabetes mellitus without  complication ([place])   . Hypertension   . Allergy   . Dysphagia   . Lipoma of forehead   . H/O alcohol abuse     Social History  Substance Use Topics  . Smoking status: Current Every Day Smoker -- 1.00 packs/day for 12 years    Types: Cigarettes  . Smokeless tobacco: Not on file     Comment: patient states that he has been trying.  . Alcohol Use: No     Current outpatient prescriptions:  .  atorvastatin (LIPITOR) 40 MG tablet, Take 40 mg by mouth at bedtime., Disp: , Rfl: 5 .  diltiazem (CARDIZEM CD) 180 MG 24 hr capsule, Take 180 mg by mouth daily., Disp: , Rfl: 5 .  fluticasone (FLONASE) 50 MCG/ACT nasal spray, USE 1 SPRAY IN EACH NOSTRIL 2 TIMES DAILY, Disp: 1 g, Rfl: 12 .  LEVEMIR FLEXTOUCH 100 UNIT/ML Pen, INJECT 45 UNITS IN AM 45 UNITS IN PM SUBQ-INCREASE BY 1 UNIT IF FASTING BLOOD SUGAR IS >THAN 120, Disp: 3 pen, Rfl: 7 .  meloxicam (MOBIC) 15 MG tablet, TAKE 1 TABLET BY MOUTH EVERY DAY, Disp: 30 tablet, Rfl: 2 .  omeprazole (PRILOSEC) 40 MG capsule, TAKE 1 CAPSULE BY MOUTH DAILY, Disp: 30 capsule, Rfl: 5  No Known Allergies  Review of Systems  Constitutional: Negative for fever, chills and weight loss.  HENT: Negative for congestion,  hearing loss, sore throat and tinnitus.   Eyes: Negative for blurred vision, double vision and redness.  Respiratory: Negative for cough, hemoptysis and shortness of breath.   Cardiovascular: Negative for chest pain, palpitations, orthopnea, claudication and leg swelling.  Gastrointestinal: Negative for heartburn, nausea, vomiting, diarrhea, constipation and blood in stool.  Genitourinary: Negative for dysuria, urgency, frequency and hematuria.  Musculoskeletal: Negative for myalgias, back pain, joint pain, falls and neck pain.  Skin: Negative for itching.  Neurological: Negative for dizziness, tingling, tremors, focal weakness, seizures, loss of consciousness, weakness and headaches.  Endo/Heme/Allergies: Does not bruise/bleed easily.   Psychiatric/Behavioral: Negative for depression and substance abuse. The patient is not nervous/anxious and does not have insomnia.      Objective  Filed Vitals:   09/02/15 0800  BP: 132/80  Pulse: 67  Temp: 97.6 F (36.4 C)  TempSrc: Oral  Resp: 16  Height: 5\' 11"  (1.803 m)  Weight: 186 lb 8 oz (84.596 kg)  SpO2: 97%     Physical Exam  Constitutional: He is oriented to person, place, and time and well-developed, well-nourished, and in no distress.  HENT:  Head: Normocephalic.  Eyes: EOM are normal. Pupils are equal, round, and reactive to light.  Neck: Normal range of motion. Neck supple. No thyromegaly present.  Cardiovascular: Normal rate, regular rhythm and normal heart sounds.   No murmur heard. Pulmonary/Chest: Effort normal and breath sounds normal. No respiratory distress. He has no wheezes.  Abdominal: Soft. Bowel sounds are normal.  Musculoskeletal: Normal range of motion. He exhibits no edema.  Lymphadenopathy:    He has no cervical adenopathy.  Neurological: He is alert and oriented to person, place, and time. No cranial nerve deficit. Gait normal. Coordination normal.  Skin: Skin is warm and dry. No rash noted.  Psychiatric: Affect and judgment normal.      Assessment & Plan  1. Uncontrolled type 1 diabetes mellitus without complication ([place]) Referral to endocrinologist for possible insulin pump - POCT glycosylated hemoglobin (Hb A1C) - POCT UA - Microalbumin - Ambulatory referral to Endocrinology - Ambulatory referral to Ophthalmology  2. Encounter for immunization Given today  3. Essential hypertension Well-controlled  - Comprehensive Metabolic Panel (CMET)  4. Hyperlipidemia Labs today - Lipid Profile - TSH  5. Prostate cancer screening Screening PSA - PSA

## 2015-09-03 LAB — LIPID PANEL
CHOLESTEROL TOTAL: 169 mg/dL (ref 100–199)
Chol/HDL Ratio: 3.3 ratio units (ref 0.0–5.0)
HDL: 52 mg/dL (ref >39)
LDL Calculated: 101 mg/dL — ABNORMAL HIGH (ref 0–99)
Triglycerides: 82 mg/dL (ref 0–149)
VLDL Cholesterol Cal: 16 mg/dL (ref 5–40)

## 2015-09-03 LAB — COMPREHENSIVE METABOLIC PANEL
ALK PHOS: 86 IU/L (ref 39–117)
ALT: 17 IU/L (ref 0–44)
AST: 15 IU/L (ref 0–40)
Albumin/Globulin Ratio: 1.6 (ref 1.1–2.5)
Albumin: 4.9 g/dL (ref 3.5–5.5)
BILIRUBIN TOTAL: 0.5 mg/dL (ref 0.0–1.2)
BUN/Creatinine Ratio: 13 (ref 9–20)
BUN: 11 mg/dL (ref 6–24)
CHLORIDE: 99 mmol/L (ref 97–108)
CO2: 24 mmol/L (ref 18–29)
Calcium: 10.5 mg/dL — ABNORMAL HIGH (ref 8.7–10.2)
Creatinine, Ser: 0.86 mg/dL (ref 0.76–1.27)
GFR calc non Af Amer: 105 mL/min/{1.73_m2} (ref >59)
GFR, EST AFRICAN AMERICAN: 121 mL/min/{1.73_m2} (ref >59)
GLUCOSE: 57 mg/dL — AB (ref 65–99)
Globulin, Total: 3 g/dL (ref 1.5–4.5)
POTASSIUM: 4.3 mmol/L (ref 3.5–5.2)
Sodium: 141 mmol/L (ref 134–144)
TOTAL PROTEIN: 7.9 g/dL (ref 6.0–8.5)

## 2015-09-03 LAB — PSA: Prostate Specific Ag, Serum: 0.6 ng/mL (ref 0.0–4.0)

## 2015-09-03 LAB — TSH: TSH: 2.16 u[IU]/mL (ref 0.450–4.500)

## 2015-09-04 ENCOUNTER — Telehealth: Payer: Self-pay | Admitting: Family Medicine

## 2015-09-04 NOTE — Telephone Encounter (Signed)
Returned pt call and still no answer so I left results on his voicemail.

## 2015-09-04 NOTE — Telephone Encounter (Signed)
Patient was returning your call. He is requesting that you call him around 12:30p because that is the time he will be going to lunch. [phone]

## 2015-09-09 ENCOUNTER — Telehealth: Payer: Self-pay | Admitting: Family Medicine

## 2015-09-09 NOTE — Telephone Encounter (Signed)
LFT MESS THAT SHOULD HAVE REFILLS AND TO CALL THE PHARM.

## 2015-09-09 NOTE — Telephone Encounter (Signed)
PT NEEDS INSULIN REFILLED. PHARM IS [facility] [address]

## 2015-09-09 NOTE — Telephone Encounter (Signed)
Levemir pen was refilled on 08/10/2015 with 7 active refills

## 2015-10-08 ENCOUNTER — Other Ambulatory Visit: Payer: Self-pay | Admitting: Family Medicine

## 2015-11-05 ENCOUNTER — Other Ambulatory Visit: Payer: Self-pay | Admitting: Family Medicine

## 2015-11-22 HISTORY — PX: LESION REMOVAL: SHX5196

## 2015-12-02 ENCOUNTER — Ambulatory Visit (INDEPENDENT_AMBULATORY_CARE_PROVIDER_SITE_OTHER): Payer: 59 | Admitting: Family Medicine

## 2015-12-02 ENCOUNTER — Encounter: Payer: Self-pay | Admitting: Family Medicine

## 2015-12-02 VITALS — BP 132/84 | HR 74 | Temp 97.9°F | Resp 16 | Ht 71.0 in | Wt 188.5 lb

## 2015-12-02 DIAGNOSIS — F1010 Alcohol abuse, uncomplicated: Secondary | ICD-10-CM

## 2015-12-02 DIAGNOSIS — I10 Essential (primary) hypertension: Secondary | ICD-10-CM

## 2015-12-02 DIAGNOSIS — E785 Hyperlipidemia, unspecified: Secondary | ICD-10-CM | POA: Diagnosis not present

## 2015-12-02 DIAGNOSIS — F1011 Alcohol abuse, in remission: Secondary | ICD-10-CM

## 2015-12-02 DIAGNOSIS — E119 Type 2 diabetes mellitus without complications: Secondary | ICD-10-CM | POA: Diagnosis not present

## 2015-12-02 LAB — POCT UA - MICROALBUMIN: Microalbumin Ur, POC: 20 mg/L

## 2015-12-02 LAB — GLUCOSE, POCT (MANUAL RESULT ENTRY)
POC Glucose: 61 mg/dl — AB (ref 70–99)
POC Glucose: 74 mg/dl (ref 70–99)

## 2015-12-02 LAB — POCT GLYCOSYLATED HEMOGLOBIN (HGB A1C): Hemoglobin A1C: >14

## 2015-12-02 NOTE — Progress Notes (Signed)
Name: Isaiah Stout.   MRN: [id number]    DOB: 1971/02/23   Date:12/02/2015       Progress Note  Subjective  Chief Complaint  Chief Complaint  Patient presents with  . Diabetes    3 month follow up, having episodes of BS dropping  . Hypertension  . Hyperlipidemia    HPI  Diabetes  Patient presents for follow-up of diabetes which is present for over 5 years years. Is currently on a regimen of Levemir 45 mg twice a day and Apidra  20 mg 3 times a day . Patient states compliance  with their diet and exercise. There's been no hypoglycemic episodes and there is no polyuria polydipsia polyphagia. His average fasting glucoses been in the low around 70 with a high around 232 . There is no end organ disease.  Last diabetic eye exam was last year was normal.   Last visit with dietitian was last year. Last microalbumin was obtained today and is normal at 20 . It is of note that he has a history of chronic alcohol abuse and was a type II diabetic on oral agents until burning out of his pancreas with alcohol  Hypertension   Patient presents for follow-up of hypertension. It has been present for over over 5 years.  Patient states that there is compliance with medical regimen which consists of diltiazem 180 mg daily. . There is no end organ disease. Cardiac risk factors include hypertension hyperlipidemia and diabetes.  Exercise regimen consist of some walking .  Diet consist of ADA .  Hyperlipidemia  Patient has a history of hyperlipidemia for over 5 years.  Current medical regimen consist of atorvastatin 40 mg daily at bedtime .  Compliance is good .  Diet and exercise are currently followed fairly well .  Risk factors for cardiovascular disease include hyperlipidemia diabetes hypertension and tobacco abuse .   There have been no side effects from the medication.    History of alcohol abuse  Patient has had no alcoholic consumption for the past 3 years she reports.  Tobacco abuse  Patient  smoking up to one pack cigarettes per day. Attempts at stopping have been unsuccessful at smoking cessation. He's been given  smoking cessation information past.  He is not currently motivated to stop smoking despite warnings of his increased risk of pulmonary as well as cardiovascular disease.   Past Medical History  Diagnosis Date  . Pancreatitis   . Diabetes mellitus without complication ([place])   . Hypertension   . Allergy   . Dysphagia   . Lipoma of forehead   . H/O alcohol abuse     Social History  Substance Use Topics  . Smoking status: Current Every Day Smoker -- 1.00 packs/day for 12 years    Types: Cigarettes  . Smokeless tobacco: Not on file     Comment: patient states that he has been trying.  . Alcohol Use: No     Current outpatient prescriptions:  .  APIDRA SOLOSTAR 100 UNIT/ML Solostar Pen, INJECT 20 UNITS SUBQ WITH BREAKFAST, LUNCH AND DINNER AS DIRECTED, Disp: , Rfl: 5 .  atorvastatin (LIPITOR) 40 MG tablet, TAKE 1 TABLET BY MOUTH AT BEDTIME, Disp: 30 tablet, Rfl: 5 .  diltiazem (CARDIZEM CD) 180 MG 24 hr capsule, TAKE ONE CAPSULE BY MOUTH EVERY DAY, Disp: 30 capsule, Rfl: 5 .  fluticasone (FLONASE) 50 MCG/ACT nasal spray, USE 1 SPRAY IN EACH NOSTRIL 2 TIMES DAILY, Disp: 1 g, Rfl: 12 .  LEVEMIR FLEXTOUCH 100 UNIT/ML Pen, INJECT 45 UNITS IN AM 45 UNITS IN PM SUBQ-INCREASE BY 1 UNIT IF FASTING BLOOD SUGAR IS >THAN 120, Disp: 3 pen, Rfl: 7 .  meloxicam (MOBIC) 15 MG tablet, TAKE 1 TABLET BY MOUTH EVERY DAY, Disp: 30 tablet, Rfl: 2 .  omeprazole (PRILOSEC) 40 MG capsule, TAKE 1 CAPSULE BY MOUTH DAILY, Disp: 30 capsule, Rfl: 5  No Known Allergies  Review of Systems  Constitutional: Negative for fever, chills and weight loss.  HENT: Negative for congestion, hearing loss, sore throat and tinnitus.   Eyes: Negative for blurred vision, double vision and redness.  Respiratory: Negative for cough, hemoptysis and shortness of breath.   Cardiovascular: Negative for chest  pain, palpitations, orthopnea, claudication and leg swelling.  Gastrointestinal: Negative for heartburn, nausea, vomiting, diarrhea, constipation and blood in stool.  Genitourinary: Negative for dysuria, urgency, frequency and hematuria.  Musculoskeletal: Negative for myalgias, back pain, joint pain, falls and neck pain.  Skin: Negative for itching.  Neurological: Negative for dizziness, tingling, tremors, focal weakness, seizures, loss of consciousness, weakness and headaches.  Endo/Heme/Allergies: Does not bruise/bleed easily.  Psychiatric/Behavioral: Negative for depression and substance abuse. The patient is not nervous/anxious and does not have insomnia.      Objective  Filed Vitals:   12/02/15 0856  BP: 132/84  Pulse: 74  Temp: 97.9 F (36.6 C)  TempSrc: Oral  Resp: 16  Height: 5\' 11"  (1.803 m)  Weight: 188 lb 8 oz (85.503 kg)  SpO2: 97%     Physical Exam  Constitutional: He is oriented to person, place, and time and well-developed, well-nourished, and in no distress.  HENT:  Head: Normocephalic.  Eyes: EOM are normal. Pupils are equal, round, and reactive to light.  Neck: Normal range of motion. Neck supple. No thyromegaly present.  Cardiovascular: Normal rate, regular rhythm and normal heart sounds.   No murmur heard. Pulmonary/Chest: Effort normal and breath sounds normal. No respiratory distress. He has no wheezes.  Abdominal: Soft. Bowel sounds are normal.  Musculoskeletal: Normal range of motion. He exhibits no edema.  Lymphadenopathy:    He has no cervical adenopathy.  Neurological: He is alert and oriented to person, place, and time. No cranial nerve deficit. Gait normal. Coordination normal.  Skin: Skin is warm and dry. No rash noted.  Psychiatric: Affect and judgment normal.      Assessment & Plan  1. Diabetes mellitus type 2 in nonobese (HCC) Uncontrolled. Now insulin-dependent - POCT HgB A1C - POCT Glucose (CBG) - POCT UA - Microalbumin -  Ambulatory referral to Endocrinology - Comprehensive Metabolic Panel (CMET) - TSH - POCT Glucose (CBG)  2. Essential hypertension Well-controlled - Comprehensive Metabolic Panel (CMET) - TSH  3. Hyperlipidemia Labs - Lipid panel  4. History of alcohol abuse Encouraged continued sobriety

## 2016-01-02 ENCOUNTER — Other Ambulatory Visit: Payer: Self-pay | Admitting: Family Medicine

## 2016-03-02 ENCOUNTER — Other Ambulatory Visit: Payer: Self-pay | Admitting: Family Medicine

## 2016-03-31 ENCOUNTER — Ambulatory Visit: Payer: 59 | Admitting: Family Medicine

## 2016-04-01 ENCOUNTER — Other Ambulatory Visit: Payer: Self-pay | Admitting: Family Medicine

## 2016-04-06 ENCOUNTER — Telehealth: Payer: Self-pay | Admitting: Family Medicine

## 2016-04-06 NOTE — Telephone Encounter (Signed)
Patient has scheduled appointment with Dr Sanda Klein for 04-20-16 @ 8:40 but will be completely out of Meloxicam. [facility]ill [facility].

## 2016-04-20 ENCOUNTER — Encounter: Payer: Self-pay | Admitting: General Surgery

## 2016-04-20 ENCOUNTER — Ambulatory Visit (INDEPENDENT_AMBULATORY_CARE_PROVIDER_SITE_OTHER): Payer: 59 | Admitting: Family Medicine

## 2016-04-20 ENCOUNTER — Encounter: Payer: Self-pay | Admitting: Family Medicine

## 2016-04-20 ENCOUNTER — Other Ambulatory Visit: Payer: Self-pay | Admitting: Family Medicine

## 2016-04-20 VITALS — BP 122/82 | HR 95 | Temp 98.2°F | Resp 16 | Wt 200.5 lb

## 2016-04-20 DIAGNOSIS — Z114 Encounter for screening for human immunodeficiency virus [HIV]: Secondary | ICD-10-CM

## 2016-04-20 DIAGNOSIS — Z5181 Encounter for therapeutic drug level monitoring: Secondary | ICD-10-CM | POA: Diagnosis not present

## 2016-04-20 DIAGNOSIS — Z1211 Encounter for screening for malignant neoplasm of colon: Secondary | ICD-10-CM

## 2016-04-20 DIAGNOSIS — Z794 Long term (current) use of insulin: Secondary | ICD-10-CM | POA: Diagnosis not present

## 2016-04-20 DIAGNOSIS — E119 Type 2 diabetes mellitus without complications: Secondary | ICD-10-CM | POA: Insufficient documentation

## 2016-04-20 DIAGNOSIS — E785 Hyperlipidemia, unspecified: Secondary | ICD-10-CM | POA: Insufficient documentation

## 2016-04-20 DIAGNOSIS — I10 Essential (primary) hypertension: Secondary | ICD-10-CM | POA: Diagnosis not present

## 2016-04-20 DIAGNOSIS — D170 Benign lipomatous neoplasm of skin and subcutaneous tissue of head, face and neck: Secondary | ICD-10-CM | POA: Insufficient documentation

## 2016-04-20 DIAGNOSIS — Z720 Tobacco use: Secondary | ICD-10-CM

## 2016-04-20 DIAGNOSIS — H409 Unspecified glaucoma: Secondary | ICD-10-CM | POA: Diagnosis not present

## 2016-04-20 HISTORY — DX: Unspecified glaucoma: H40.9

## 2016-04-20 HISTORY — DX: Tobacco use: Z72.0

## 2016-04-20 NOTE — Assessment & Plan Note (Addendum)
Discussed starting at age 45; consider colonoscopy or Cologuard and patient may call back; he then decided to just go with the Cologuard; ordered

## 2016-04-20 NOTE — Progress Notes (Signed)
BP 122/82 mmHg  Pulse 95  Temp(Src) 98.2 F (36.8 C) (Oral)  Resp 16  Wt 200 lb 8 oz (90.946 kg)  SpO2 97%   Subjective:    Patient ID: Isaiah Northern., male    DOB: 02-22-1971, 45 y.o.   MRN: [id number]  HPI: Isaiah Drumm. is a 45 y.o. male  Chief Complaint  Patient presents with  . Medication Refill   Patient is new to me; his usual provider is out of the office  HTN; on CCB and doing well; no fatigue on medicine  Tough to treat diabetes, followed by Dr. Graceann Congress; on insulin and metformin; 50 this morning; 243, 201, 153; the 50 was a rare low; he is fasting for labs today and did not eat  High cholesterol; on atorvastatin, tolerating fine; tries to eat a good diet; eating eggs just on the weekends, 4; some processed pork  Tobacco abuse; he has tried to quit before twice; not quite ready to quit right now  He has a cyst on the left forehead; Dr. Felton Clinton saw him but they wanted to get his sugars controlled before doing surgery; he thinks he can go back now, DM controlled  Depression screen [facility] 2/9 04/20/2016 12/02/2015 09/02/2015  Decreased Interest 0 0 0  Down, Depressed, Hopeless 0 0 0  PHQ - 2 Score 0 0 0   Relevant past medical, surgical, family and social history reviewed Past Medical History  Diagnosis Date  . Pancreatitis   . Diabetes mellitus without complication ([place])   . Hypertension   . Allergy   . Dysphagia   . Lipoma of forehead   . H/O alcohol abuse   . Glaucoma 04/20/2016  . Tobacco abuse 04/20/2016   No past surgical history on file.   Family History  Problem Relation Age of Onset  . Diabetes Mother   . Emphysema Father    Social History  Substance Use Topics  . Smoking status: Current Every Day Smoker -- 1.00 packs/day for 12 years    Types: Cigarettes  . Smokeless tobacco: None     Comment: patient states that he has been trying.  . Alcohol Use: No   Interim medical history since last visit reviewed. Allergies and medications  reviewed  Review of Systems Per HPI unless specifically indicated above     Objective:    BP 122/82 mmHg  Pulse 95  Temp(Src) 98.2 F (36.8 C) (Oral)  Resp 16  Wt 200 lb 8 oz (90.946 kg)  SpO2 97%  Wt Readings from Last 3 Encounters:  04/20/16 200 lb 8 oz (90.946 kg)  12/02/15 188 lb 8 oz (85.503 kg)  09/02/15 186 lb 8 oz (84.596 kg)    Physical Exam  Constitutional: He appears well-developed and well-nourished. No distress.  HENT:  Head: Atraumatic.  Eyes: EOM are normal. No scleral icterus.  Neck: No thyromegaly present.  Cardiovascular: Normal rate and regular rhythm.   Pulmonary/Chest: Effort normal and breath sounds normal.  Abdominal: Soft. Bowel sounds are normal. He exhibits no distension.  Musculoskeletal: He exhibits no edema.  Neurological: He is alert.  Skin: Skin is warm and dry. No pallor.  Soft large symmetric mass on left upper forehead; no overlying skin changes (about the size of half of a golf ball)  Psychiatric: He has a normal mood and affect. His behavior is normal. Judgment and thought content normal. His mood appears not anxious. He does not exhibit a depressed mood.   Diabetic  Foot Form - Detailed   Diabetic Foot Exam - detailed  Diabetic Foot exam was performed with the following findings:  Yes 04/20/2016  8:46 AM  Visual Foot Exam completed.:  Yes  Are the toenails long?:  No  Are the toenails thick?:  No  Are the toenails ingrown?:  No  Normal Range of Motion:  Yes    Pulse Foot Exam completed.:  Yes  Right Dorsalis Pedis:  Present Left Dorsalis Pedis:  Present  Sensory Foot Exam Completed.:  Yes  Swelling:  No  Semmes-Weinstein Monofilament Test  R Site 1-Great Toe:  Pos L Site 1-Great Toe:  Pos  R Site 4:  Pos L Site 4:  Pos  R Site 5:  Pos L Site 5:  Pos          Assessment & Plan:   Problem List Items Addressed This Visit      Cardiovascular and Mediastinum   Essential hypertension, benign - Primary    Well-controlled on  current med; I'm not sure why he's on the CCB if he has diabetes, but I'm new to him and won't make chnages today; he has been followed by his usual provider and an endocrinologist; I did encourage him to quit smoking      Relevant Medications   aspirin EC 81 MG tablet     Endocrine   Diabetes mellitus type II, controlled, with no complications ([place])    Foot exam by MD; followed primarily by endo      Relevant Medications   metFORMIN (GLUCOPHAGE) 1000 MG tablet   Insulin Glargine (BASAGLAR KWIKPEN) 100 UNIT/ML SOPN   aspirin EC 81 MG tablet   Other Relevant Orders   Lipid Panel w/o Chol/HDL Ratio (Completed)   Microalbumin / creatinine urine ratio (Completed)     Other   Colon cancer screening    Discussed starting at age 83; consider colonoscopy or Cologuard and patient may call back; he then decided to just go with the Cologuard; ordered      Relevant Orders   Cologuard   Dyslipidemia    Check lipids, goal LDL under 100, goal HDL 40+; limit saturated fats      Relevant Orders   Lipid Panel w/o Chol/HDL Ratio (Completed)   Encounter for screening for HIV    Discussed one-time HIV screening recommendation per USPSTF guidelines; patient agrees with testing; HIV antibody ordered      Relevant Orders   HIV antibody   Glaucoma   Lipoma of forehead    Refer back to surgeon for evaluation, excision      Relevant Orders   Ambulatory referral to General Surgery   Tobacco abuse    Encouraged patient to quit smoking; see AVS       Other Visit Diagnoses    Medication monitoring encounter        Relevant Orders    Comprehensive metabolic panel (Completed)       Follow up plan: Return in about 6 months (around 10/20/2016) for labs (NON_fasting) and visit.  An after-visit summary was printed and given to the patient at [facility].  Please see the patient instructions which may contain other information and recommendations beyond what is mentioned above in the assessment  and plan.  Meds ordered this encounter  Medications  . metFORMIN (GLUCOPHAGE) 1000 MG tablet    Sig: Take 1 tablet by mouth 2 (two) times daily.    Refill:  11  . Insulin Glargine (BASAGLAR KWIKPEN) 100 UNIT/ML SOPN  Sig: INJECT 80 UNITS SUBCUTANEOUSLY ONCE DAILY. SPLIT IN TWO INJECTIONS OF [place].    Refill:  12  . aspirin EC 81 MG tablet    Sig: Take 1 tablet (81 mg total) by mouth daily.    Orders Placed This Encounter  Procedures  . Comprehensive metabolic panel  . Lipid Panel w/o Chol/HDL Ratio  . Microalbumin / creatinine urine ratio  . HIV antibody  . Cologuard  . Ambulatory referral to General Surgery

## 2016-04-20 NOTE — Patient Instructions (Addendum)
I do encourage you to quit smoking Call [phone] to sign up for smoking cessation classes You can call 1-800-QUIT-NOW to talk with a smoking cessation coach  Have labs done today You have my blessing in the future to not fast for your labs here Please do see your eye doctor regularly, and have your eyes examined every year (or more often per his or her recommendation) Check your feet every night and let me know right away of any sores, infections, numbness, etc. Try to limit sweets, white bread, white rice, white potatoes It is okay with me for you to not check your fingerstick blood sugars (per [employer] of Endocrinology Best Practices), unless you are interested and feel it would be helpful for you Try to limit saturated fats in your diet (bologna, hot dogs, barbeque, cheeseburgers, hamburgers, steak, bacon, sausage, cheese, etc.) and get more fresh fruits, vegetables, and whole grains    Steps to Quit Smoking  Smoking tobacco can be harmful to your health and can affect almost every organ in your body. Smoking puts you, and those around you, at risk for developing many serious chronic diseases. Quitting smoking is difficult, but it is one of the best things that you can do for your health. It is never too late to quit. WHAT ARE THE BENEFITS OF QUITTING SMOKING? When you quit smoking, you lower your risk of developing serious diseases and conditions, such as:  Lung cancer or lung disease, such as COPD.  Heart disease.  Stroke.  Heart attack.  Infertility.  Osteoporosis and bone fractures. Additionally, symptoms such as coughing, wheezing, and shortness of breath may get better when you quit. You may also find that you get sick less often because your body is stronger at fighting off colds and infections. If you are pregnant, quitting smoking can help to reduce your chances of having a baby of low birth weight. HOW DO I GET READY TO QUIT? When you decide to quit smoking,  create a plan to make sure that you are successful. Before you quit:  Pick a date to quit. Set a date within the next two weeks to give you time to prepare.  Write down the reasons why you are quitting. Keep this list in places where you will see it often, such as on your bathroom mirror or in your car or wallet.  Identify the people, places, things, and activities that make you want to smoke (triggers) and avoid them. Make sure to take these actions:  Throw away all cigarettes at home, at work, and in your car.  Throw away smoking accessories, such as Scientist, research (medical).  Clean your car and make sure to empty the ashtray.  Clean your home, including curtains and carpets.  Tell your family, friends, and coworkers that you are quitting. Support from your loved ones can make quitting easier.  Talk with your health care provider about your options for quitting smoking.  Find out what treatment options are covered by your health insurance. WHAT STRATEGIES CAN I USE TO QUIT SMOKING?  Talk with your healthcare provider about different strategies to quit smoking. Some strategies include:  Quitting smoking altogether instead of gradually lessening how much you smoke over a period of time. Research shows that quitting "cold [place]" is more successful than gradually quitting.  Attending in-person counseling to help you build problem-solving skills. You are more likely to have success in quitting if you attend several counseling sessions. Even short sessions of 10 minutes can  be effective.  Finding resources and support systems that can help you to quit smoking and remain smoke-free after you quit. These resources are most helpful when you use them often. They can include:  Online chats with a Social worker.  Telephone quitlines.  Printed Furniture conservator/restorer.  Support groups or group counseling.  Text messaging programs.  Mobile phone applications.  Taking medicines to help you quit  smoking. (If you are pregnant or breastfeeding, talk with your health care provider first.) Some medicines contain nicotine and some do not. Both types of medicines help with cravings, but the medicines that include nicotine help to relieve withdrawal symptoms. Your health care provider may recommend:  Nicotine patches, gum, or lozenges.  Nicotine inhalers or sprays.  Non-nicotine medicine that is taken by mouth. Talk with your health care provider about combining strategies, such as taking medicines while you are also receiving in-person counseling. Using these two strategies together makes you more likely to succeed in quitting than if you used either strategy on its own. If you are pregnant or breastfeeding, talk with your health care provider about finding counseling or other support strategies to quit smoking. Do not take medicine to help you quit smoking unless told to do so by your health care provider. WHAT THINGS CAN I DO TO MAKE IT EASIER TO QUIT? Quitting smoking might feel overwhelming at first, but there is a lot that you can do to make it easier. Take these important actions:  Reach out to your family and friends and ask that they support and encourage you during this time. Call telephone quitlines, reach out to support groups, or work with a counselor for support.  Ask people who smoke to avoid smoking around you.  Avoid places that trigger you to smoke, such as bars, parties, or smoke-break areas at work.  Spend time around people who do not smoke.  Lessen stress in your life, because stress can be a smoking trigger for some people. To lessen stress, try:  Exercising regularly.  Deep-breathing exercises.  Yoga.  Meditating.  Performing a body scan. This involves closing your eyes, scanning your body from head to toe, and noticing which parts of your body are particularly tense. Purposefully relax the muscles in those areas.  Download or purchase mobile phone or tablet  apps (applications) that can help you stick to your quit plan by providing reminders, tips, and encouragement. There are many free apps, such as QuitGuide from the [employer] Office manager for Disease Control and Prevention). You can find other support for quitting smoking (smoking cessation) through smokefree.gov and other websites. HOW WILL I FEEL WHEN I QUIT SMOKING? Within the first 24 hours of quitting smoking, you may start to feel some withdrawal symptoms. These symptoms are usually most noticeable 2-3 days after quitting, but they usually do not last beyond 2-3 weeks. Changes or symptoms that you might experience include:  Mood swings.  Restlessness, anxiety, or irritation.  Difficulty concentrating.  Dizziness.  Strong cravings for sugary foods in addition to nicotine.  Mild weight gain.  Constipation.  Nausea.  Coughing or a sore throat.  Changes in how your medicines work in your body.  A depressed mood.  Difficulty sleeping (insomnia). After the first 2-3 weeks of quitting, you may start to notice more positive results, such as:  Improved sense of smell and taste.  Decreased coughing and sore throat.  Slower heart rate.  Lower blood pressure.  Clearer skin.  The ability to breathe more easily.  Fewer  sick days. Quitting smoking is very challenging for most people. Do not get discouraged if you are not successful the first time. Some people need to make many attempts to quit before they achieve long-term success. Do your best to stick to your quit plan, and talk with your health care provider if you have any questions or concerns.   This information is not intended to replace advice given to you by your health care provider. Make sure you discuss any questions you have with your health care provider.   Document Released: 11/01/2001 Document Revised: 03/24/2015 Document Reviewed: 03/24/2015 Elsevier Interactive Patient Education [employer].

## 2016-04-21 ENCOUNTER — Other Ambulatory Visit: Payer: Self-pay | Admitting: Family Medicine

## 2016-04-21 ENCOUNTER — Encounter: Payer: Self-pay | Admitting: Family Medicine

## 2016-04-21 LAB — COMPREHENSIVE METABOLIC PANEL
A/G RATIO: 1.6 (ref 1.2–2.2)
ALBUMIN: 4.6 g/dL (ref 3.5–5.5)
ALK PHOS: 65 IU/L (ref 39–117)
ALT: 19 IU/L (ref 0–44)
AST: 18 IU/L (ref 0–40)
BILIRUBIN TOTAL: 0.3 mg/dL (ref 0.0–1.2)
BUN / CREAT RATIO: 12 (ref 9–20)
BUN: 13 mg/dL (ref 6–24)
CHLORIDE: 102 mmol/L (ref 96–106)
CO2: 24 mmol/L (ref 18–29)
Calcium: 10.1 mg/dL (ref 8.7–10.2)
Creatinine, Ser: 1.09 mg/dL (ref 0.76–1.27)
GFR calc non Af Amer: 82 mL/min/{1.73_m2} (ref >59)
GFR, EST AFRICAN AMERICAN: 94 mL/min/{1.73_m2} (ref >59)
GLOBULIN, TOTAL: 2.9 g/dL (ref 1.5–4.5)
GLUCOSE: 70 mg/dL (ref 65–99)
POTASSIUM: 4.5 mmol/L (ref 3.5–5.2)
SODIUM: 143 mmol/L (ref 134–144)
TOTAL PROTEIN: 7.5 g/dL (ref 6.0–8.5)

## 2016-04-21 LAB — LIPID PANEL W/O CHOL/HDL RATIO
CHOLESTEROL TOTAL: 126 mg/dL (ref 100–199)
HDL: 43 mg/dL (ref >39)
LDL Calculated: 61 mg/dL (ref 0–99)
Triglycerides: 110 mg/dL (ref 0–149)
VLDL CHOLESTEROL CAL: 22 mg/dL (ref 5–40)

## 2016-04-21 LAB — HIV ANTIBODY (ROUTINE TESTING W REFLEX): HIV SCREEN 4TH GENERATION: NONREACTIVE

## 2016-04-21 LAB — MICROALBUMIN / CREATININE URINE RATIO
Creatinine, Urine: 151.4 mg/dL
MICROALB/CREAT RATIO: 59.8 mg/g{creat} — AB (ref 0.0–30.0)
MICROALBUM., U, RANDOM: 90.5 ug/mL

## 2016-04-21 NOTE — Assessment & Plan Note (Signed)
Check lipids, goal LDL under 100, goal HDL 40+; limit saturated fats

## 2016-04-21 NOTE — Assessment & Plan Note (Signed)
Discussed one-time HIV screening recommendation per USPSTF guidelines; patient agrees with testing; HIV antibody ordered 

## 2016-04-21 NOTE — Assessment & Plan Note (Signed)
Foot exam by MD; followed primarily by endo

## 2016-04-21 NOTE — Assessment & Plan Note (Signed)
Refer back to surgeon for evaluation, excision

## 2016-04-21 NOTE — Assessment & Plan Note (Signed)
Encouraged patient to quit smoking; see AVS 

## 2016-04-21 NOTE — Assessment & Plan Note (Signed)
Well-controlled on current med; I'm not sure why he's on the CCB if he has diabetes, but I'm new to him and won't make chnages today; he has been followed by his usual provider and an endocrinologist; I did encourage him to quit smoking

## 2016-04-25 ENCOUNTER — Encounter: Payer: Self-pay | Admitting: *Deleted

## 2016-05-02 ENCOUNTER — Encounter: Payer: Self-pay | Admitting: General Surgery

## 2016-05-02 ENCOUNTER — Ambulatory Visit (INDEPENDENT_AMBULATORY_CARE_PROVIDER_SITE_OTHER): Payer: BLUE CROSS/BLUE SHIELD | Admitting: General Surgery

## 2016-05-02 VITALS — BP 120/78 | HR 82 | Resp 14 | Ht 72.0 in | Wt 201.0 lb

## 2016-05-02 DIAGNOSIS — D170 Benign lipomatous neoplasm of skin and subcutaneous tissue of head, face and neck: Secondary | ICD-10-CM

## 2016-05-02 NOTE — Progress Notes (Signed)
Patient ID: Isaiah Northern., male   DOB: 1971-07-04, 45 y.o.   MRN: [id number]  Chief Complaint  Patient presents with  . Other    lipoma    HPI Isaiah Romack. is a 45 y.o. male here for assessment of a lipoma on his forehead. He states that he first noticed this about 3 years ago. He denies any pain or tenderness. He reports the area was very small and got large quickly. He states he feels the size has not changed in the last 2 years.  I personally reviewed the patient's history. HPI  Past Medical History  Diagnosis Date  . Pancreatitis   . Diabetes mellitus without complication ([place])   . Hypertension   . Allergy   . Dysphagia   . Lipoma of forehead   . H/O alcohol abuse   . Glaucoma 04/20/2016  . Tobacco abuse 04/20/2016    No past surgical history on file.  Family History  Problem Relation Age of Onset  . Diabetes Mother   . Emphysema Father     Social History Social History  Substance Use Topics  . Smoking status: Current Every Day Smoker -- 1.00 packs/day for 14 years    Types: Cigarettes  . Smokeless tobacco: Never Used     Comment: patient states that he has been trying.  . Alcohol Use: No    No Known Allergies  Current Outpatient Prescriptions  Medication Sig Dispense Refill  . APIDRA SOLOSTAR 100 UNIT/ML Solostar Pen INJECT 20 UNITS SUBQ WITH BREAKFAST, LUNCH AND DINNER AS DIRECTED  5  . aspirin EC 81 MG tablet Take 1 tablet (81 mg total) by mouth daily.    Marland Kitchen atorvastatin (LIPITOR) 40 MG tablet TAKE 1 TABLET BY MOUTH AT BEDTIME 30 tablet 5  . diltiazem (CARDIZEM CD) 180 MG 24 hr capsule TAKE ONE CAPSULE BY MOUTH EVERY DAY 30 capsule 5  . fluticasone (FLONASE) 50 MCG/ACT nasal spray USE 1 SPRAY IN EACH NOSTRIL 2 TIMES DAILY 1 g 12  . Insulin Glargine (BASAGLAR KWIKPEN) 100 UNIT/ML SOPN INJECT 80 UNITS SUBCUTANEOUSLY ONCE DAILY. SPLIT IN TWO INJECTIONS OF 40 UNITS EACH.  12  . meloxicam (MOBIC) 15 MG tablet TAKE 1 TABLET BY MOUTH EVERY DAY 30 tablet  2  . metFORMIN (GLUCOPHAGE) 1000 MG tablet Take 1 tablet by mouth 2 (two) times daily.  11  . omeprazole (PRILOSEC) 40 MG capsule TAKE 1 CAPSULE BY MOUTH DAILY 30 capsule 5   No current facility-administered medications for this visit.    Review of Systems Review of Systems  Constitutional: Negative.   Respiratory: Negative.   Cardiovascular: Negative.     Blood pressure 120/78, pulse 82, resp. rate 14, height 6' (1.829 m), weight 201 lb (91.173 kg).  Physical Exam Physical Exam  Constitutional: He is oriented to person, place, and time. He appears well-developed and well-nourished.  HENT:  Head:    Eyes: Conjunctivae are normal. No scleral icterus.  Neck: Neck supple.  Cardiovascular: Normal rate, regular rhythm and normal heart sounds.   Pulmonary/Chest: Effort normal and breath sounds normal.  Lymphadenopathy:       Head (right side): No posterior auricular adenopathy present.       Head (left side): No posterior auricular adenopathy present.    He has no cervical adenopathy.  Neurological: He is alert and oriented to person, place, and time.  Skin: Skin is warm and dry.  5 x 5 cm lipoma of the left upper forehead He  also has a 1 cm lipoma superior and lateral to the right eye.   Psychiatric: He has a normal mood and affect.    Data Reviewed May 2017 laboratory studies.  Assessment    Lipoma left forehead.    Plan    The procedure for excision was reviewed. This will be scheduled as an office event in the near future.    PCP: Isaiah Stout This has been scribed by Lesly Rubenstein LPN    Robert Bellow 05/02/2016, 9:22 PM

## 2016-05-25 ENCOUNTER — Encounter: Payer: Self-pay | Admitting: General Surgery

## 2016-05-25 ENCOUNTER — Ambulatory Visit (INDEPENDENT_AMBULATORY_CARE_PROVIDER_SITE_OTHER): Payer: BLUE CROSS/BLUE SHIELD | Admitting: General Surgery

## 2016-05-25 VITALS — BP 142/86 | HR 84 | Resp 16 | Ht 72.0 in | Wt 200.0 lb

## 2016-05-25 DIAGNOSIS — D170 Benign lipomatous neoplasm of skin and subcutaneous tissue of head, face and neck: Secondary | ICD-10-CM | POA: Diagnosis not present

## 2016-05-25 MED ORDER — HYDROCODONE-ACETAMINOPHEN 5-325 MG PO TABS: 1.0000 | ORAL_TABLET | ORAL | Status: DC | PRN

## 2016-05-25 NOTE — Patient Instructions (Addendum)
Use ice to the area. You may shower. Remove dressing in 2 days. Leave the steri strips in place they will come off in about a week.

## 2016-05-25 NOTE — Progress Notes (Signed)
Patient ID: Isaiah Stout., male   DOB: [birth year], 45 y.o.   MRN: [id number]  Chief Complaint  Patient presents with  . Procedure    HPI Isaiah Stout. is a 45 y.o. male here for an excision of a mass from his forehead. He reports no changes in the area since he was last seen.  HPI  Past Medical History  Diagnosis Date  . Pancreatitis   . Diabetes mellitus without complication ([place])   . Hypertension   . Allergy   . Dysphagia   . Lipoma of forehead   . H/O alcohol abuse   . Glaucoma 04/20/2016  . Tobacco abuse 04/20/2016    No past surgical history on file.  Family History  Problem Relation Age of Onset  . Diabetes Mother   . Emphysema Father     Social History Social History  Substance Use Topics  . Smoking status: Current Every Day Smoker -- 1.00 packs/day for 14 years    Types: Cigarettes  . Smokeless tobacco: Never Used     Comment: patient states that he has been trying.  . Alcohol Use: No    No Known Allergies  Current Outpatient Prescriptions  Medication Sig Dispense Refill  . APIDRA SOLOSTAR 100 UNIT/ML Solostar Pen INJECT 20 UNITS SUBQ WITH BREAKFAST, LUNCH AND DINNER AS DIRECTED  5  . aspirin EC 81 MG tablet Take 1 tablet (81 mg total) by mouth daily.    Marland Kitchen atorvastatin (LIPITOR) 40 MG tablet TAKE 1 TABLET BY MOUTH AT BEDTIME 30 tablet 5  . diltiazem (CARDIZEM CD) 180 MG 24 hr capsule TAKE ONE CAPSULE BY MOUTH EVERY DAY 30 capsule 5  . fluticasone (FLONASE) 50 MCG/ACT nasal spray USE 1 SPRAY IN EACH NOSTRIL 2 TIMES DAILY 1 g 12  . Insulin Glargine (BASAGLAR KWIKPEN) 100 UNIT/ML SOPN INJECT 80 UNITS SUBCUTANEOUSLY ONCE DAILY. SPLIT IN TWO INJECTIONS OF 40 UNITS EACH.  12  . meloxicam (MOBIC) 15 MG tablet TAKE 1 TABLET BY MOUTH EVERY DAY 30 tablet 2  . metFORMIN (GLUCOPHAGE) 1000 MG tablet Take 1 tablet by mouth 2 (two) times daily.  11  . omeprazole (PRILOSEC) 40 MG capsule TAKE 1 CAPSULE BY MOUTH DAILY 30 capsule 5  . HYDROcodone-acetaminophen  (NORCO) 5-325 MG tablet Take 1-2 tablets by mouth every 4 (four) hours as needed for moderate pain. 30 tablet 0   No current facility-administered medications for this visit.    Review of Systems Review of Systems  Constitutional: Negative.   Respiratory: Negative.   Cardiovascular: Negative.     Blood pressure 142/86, pulse 84, resp. rate 16, height 6' (1.829 m), weight 200 lb (90.719 kg).  Physical Exam Physical Exam  Constitutional: He is oriented to person, place, and time. He appears well-developed and well-nourished.  HENT:  Head:    Neurological: He is alert and oriented to person, place, and time.  Skin: Skin is warm and dry.  Psychiatric: He has a normal mood and affect.    Assessment    Lipoma left forehead.    Plan    Plans for excision were reviewed. The area was cleansed with alcohol followed by 15 mL of 0.5% Xylocaine with 0.25% Marcaine with 1-200,000 epinephrine. This was well tolerated. The area was cleansed with ChloraPrep. A transverse incision approximately 2 cm below the hairline was made over the central portion of the mass. Hemostasis was with 3-0 Vicryl suture ligatures for the arterial vessels on the inferior aspect of the dermis.  The lesion was found to be below the Cipro frontalis muscle which was split to allow exposure. The area was bluntly dissected free and sent in formalin for routine histology. This was approximate 4 cm in diameter. The deep layer of the wound was approximated with interrupted 3-0 Vicryl sutures. The skin was closed with a running 4-0 Vicryl subcuticular suture. Benzoin, Steri-Strips, Telfa and Tegaderm dressing applied.  Ice pack provided.  The patient was observed for 10 minutes with no evidence of bleeding.  Postoperative wound care reviewed.  Prescription for Norco provided.     PCP: Dr. Elesa Hacker, [place] 05/25/2016, 7:55 PM

## 2016-05-30 ENCOUNTER — Telehealth: Payer: Self-pay

## 2016-05-30 NOTE — Telephone Encounter (Signed)
Patient notified of results. Verbalized understanding and will follow up as scheduled.

## 2016-05-30 NOTE — Telephone Encounter (Signed)
-----   Message from Robert Bellow, MD sent at 05/29/2016  9:00 AM EDT ----- Please notify the patient the pathology was fine. Just fatty tissue.  ----- Message -----    From: Lab in [facility]    Sent: 05/27/2016   1:59 PM      To: Robert Bellow, MD

## 2016-06-02 ENCOUNTER — Ambulatory Visit: Payer: BLUE CROSS/BLUE SHIELD | Admitting: *Deleted

## 2016-06-02 DIAGNOSIS — D170 Benign lipomatous neoplasm of skin and subcutaneous tissue of head, face and neck: Secondary | ICD-10-CM

## 2016-06-02 NOTE — Progress Notes (Signed)
Patient came in today for a wound check.  The wound is clean, with no signs of infection noted. Steri strip intact. Aware of pathology. Follow up as needed.

## 2016-06-02 NOTE — Patient Instructions (Signed)
The patient is aware to call back for any questions or concerns.  

## 2016-06-14 DIAGNOSIS — E782 Mixed hyperlipidemia: Secondary | ICD-10-CM | POA: Diagnosis not present

## 2016-06-14 DIAGNOSIS — Z794 Long term (current) use of insulin: Secondary | ICD-10-CM | POA: Diagnosis not present

## 2016-06-14 DIAGNOSIS — E1165 Type 2 diabetes mellitus with hyperglycemia: Secondary | ICD-10-CM | POA: Diagnosis not present

## 2016-06-21 DIAGNOSIS — H40153 Residual stage of open-angle glaucoma, bilateral: Secondary | ICD-10-CM | POA: Diagnosis not present

## 2016-06-24 ENCOUNTER — Other Ambulatory Visit: Payer: Self-pay | Admitting: Family Medicine

## 2016-06-29 NOTE — Telephone Encounter (Signed)
Last Cr reviewed; Rx approved, changed to PRN

## 2016-07-04 IMAGING — CR DG TIBIA/FIBULA 2V*L*
1 series · 4 of 4 positions shown · non-contrast
Comparison: None.

CLINICAL DATA: Lower back pain for 3 weeks, radiating into left
leg. No known injury.

EXAM:
LEFT TIBIA AND FIBULA - 2 VIEW

[Series 1: dg tibia/fibula left · 0.14mm/px · 4 of 4 slices shown]
[im 1/4]
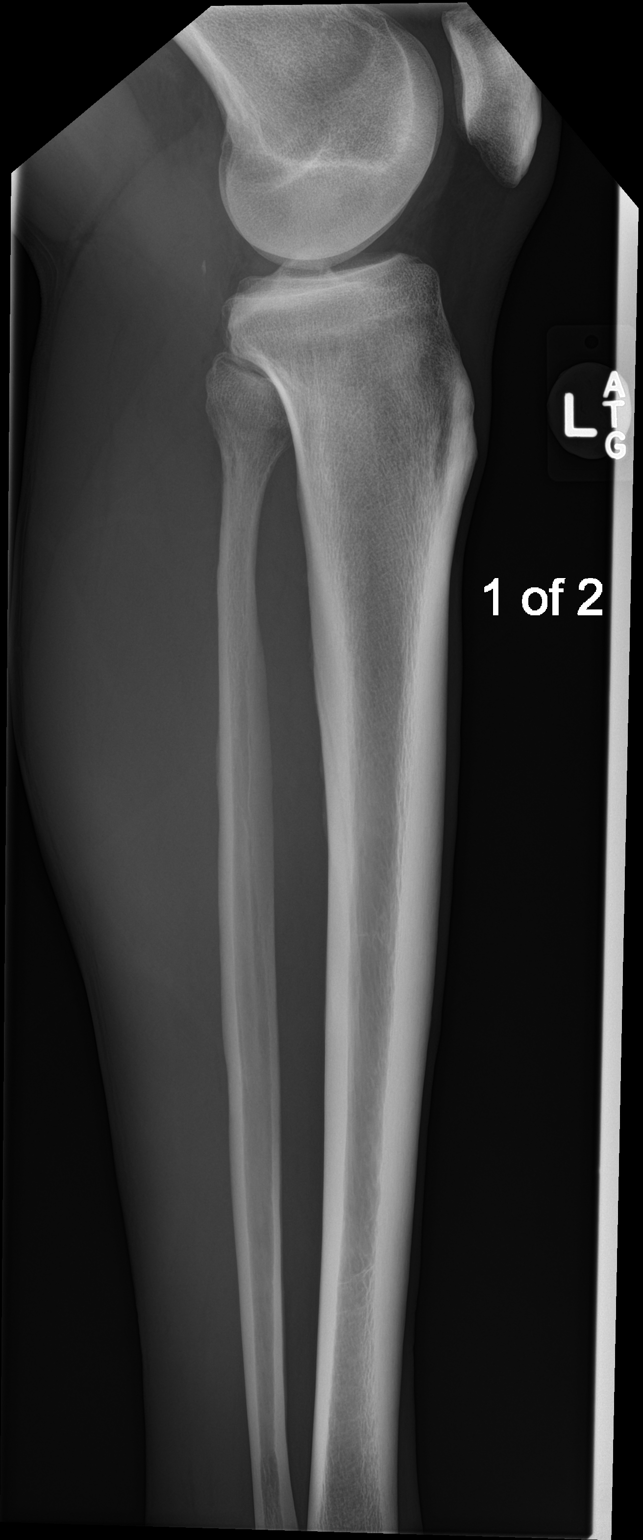
[im 2/4]
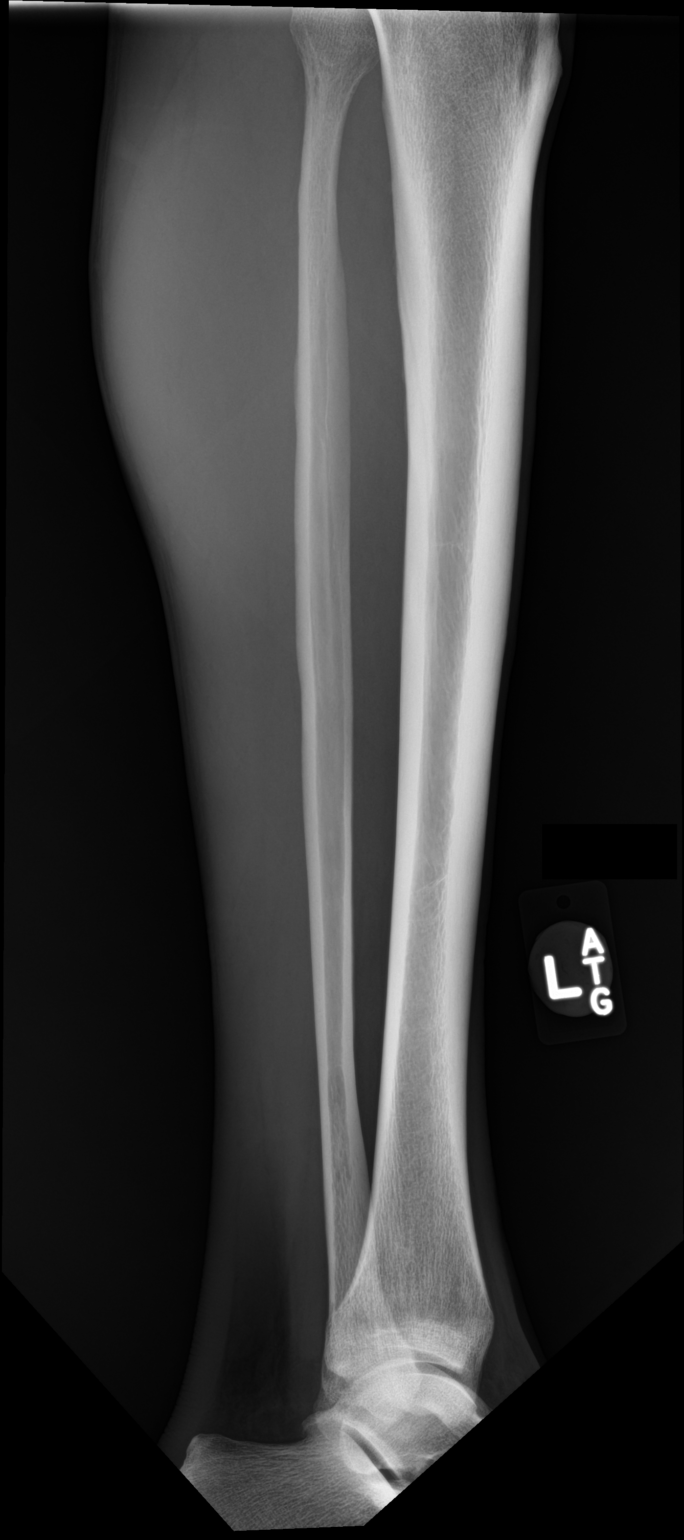
[im 3/4]
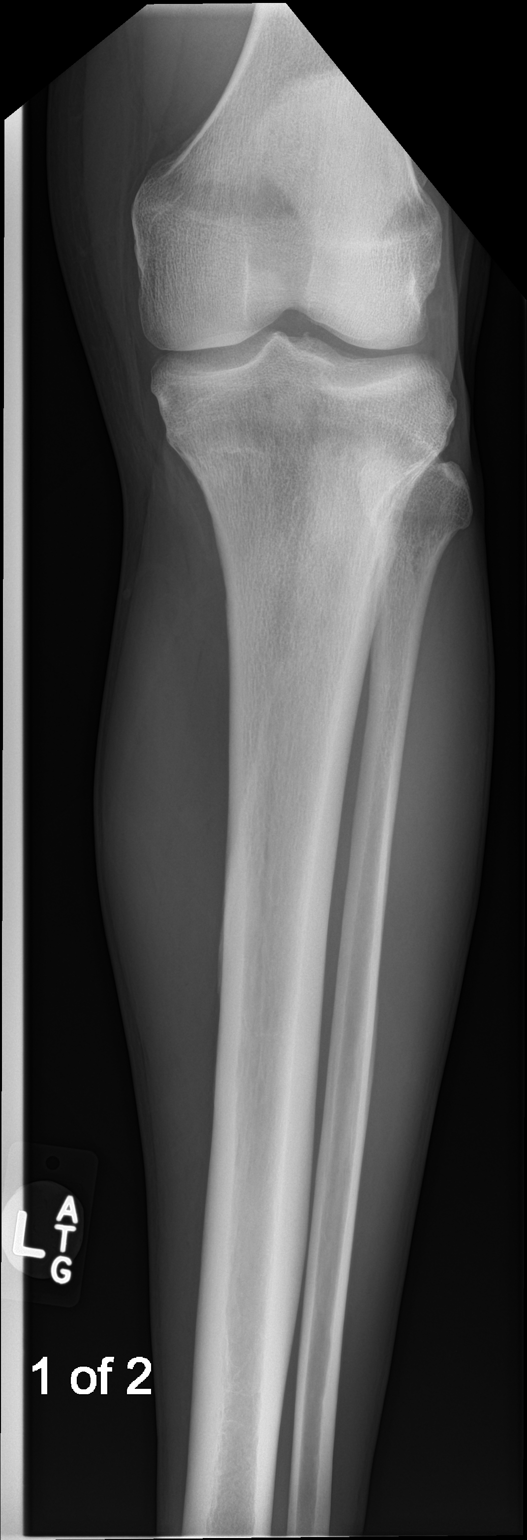
[im 4/4]
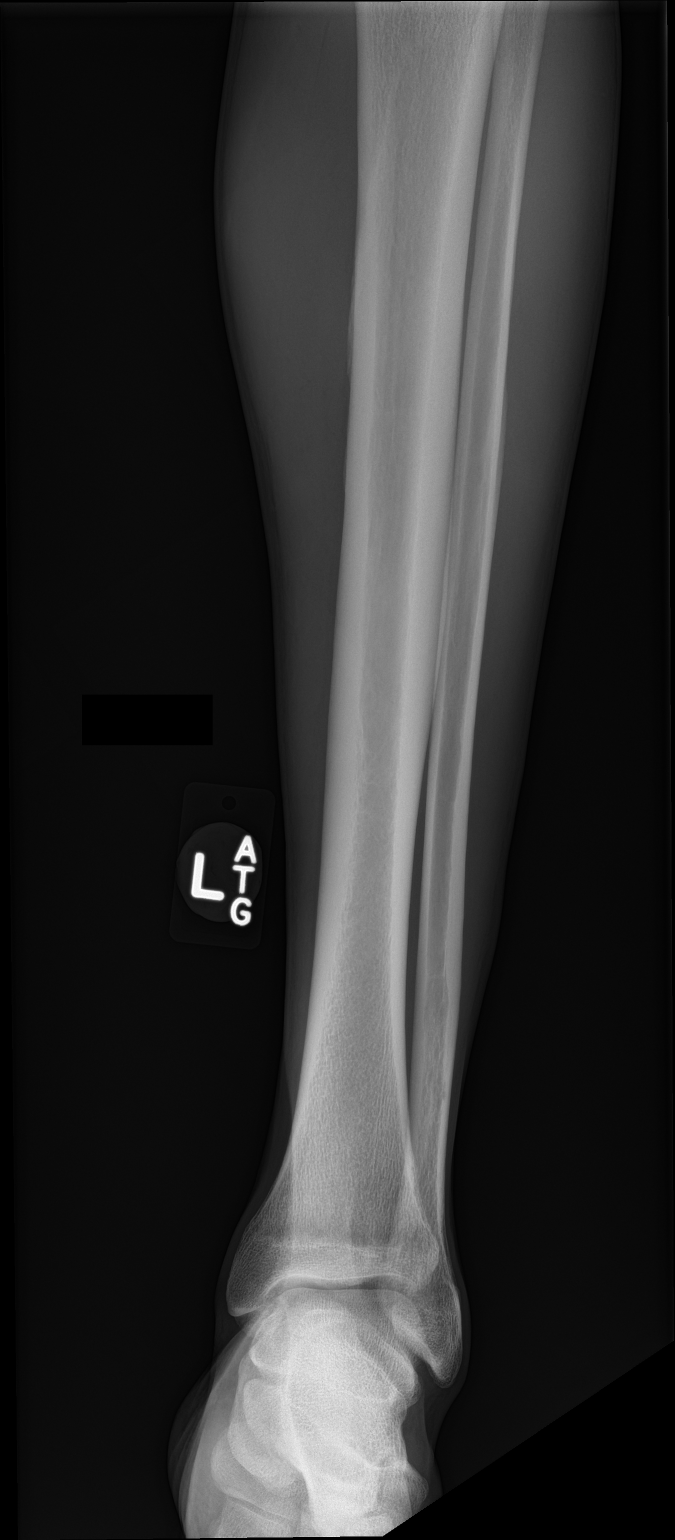

[4 of 4 positions shown; findings below may reference images not displayed]

FINDINGS: There is no evidence of fracture or other focal bone lesions. Soft
tissues are unremarkable.
IMPRESSION: Negative.

## 2016-07-04 IMAGING — CR DG HIP (WITH OR WITHOUT PELVIS) 2-3V*L*
1 series · 4 of 4 positions shown · non-contrast
Comparison: None.

CLINICAL DATA: Lower back pain for 3 weeks. Pain radiates into left
leg. No known injury.

EXAM:
LEFT HIP (WITH PELVIS) 2-3 VIEWS

[Series 1: dg hip unilat with pelvis 2-3 views left · 0.14mm/px · 4 of 4 slices shown]
[im 1/4]
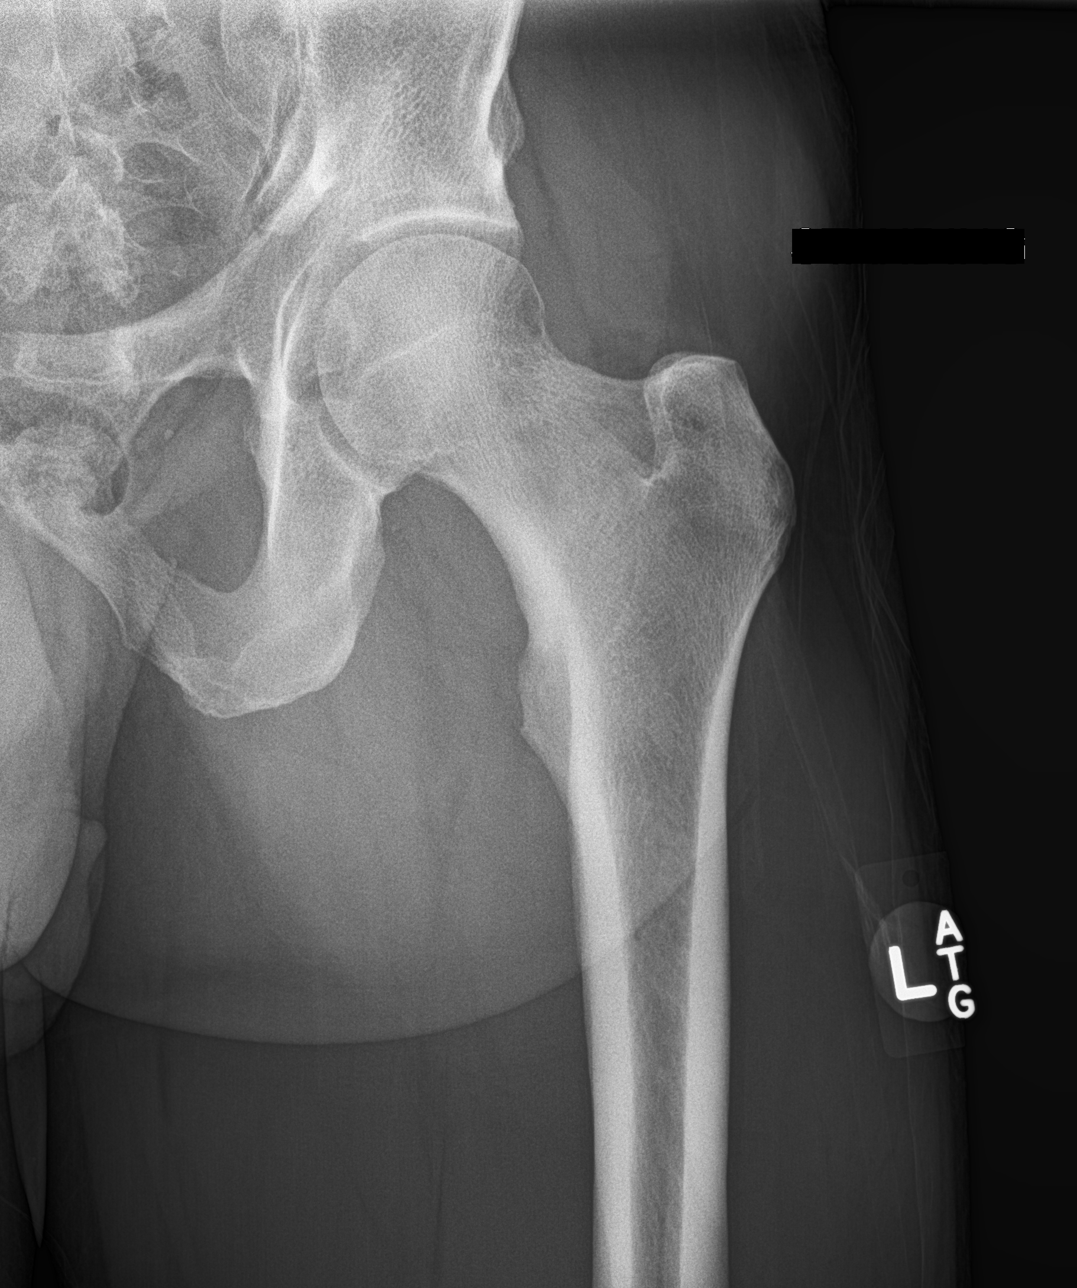
[im 2/4]
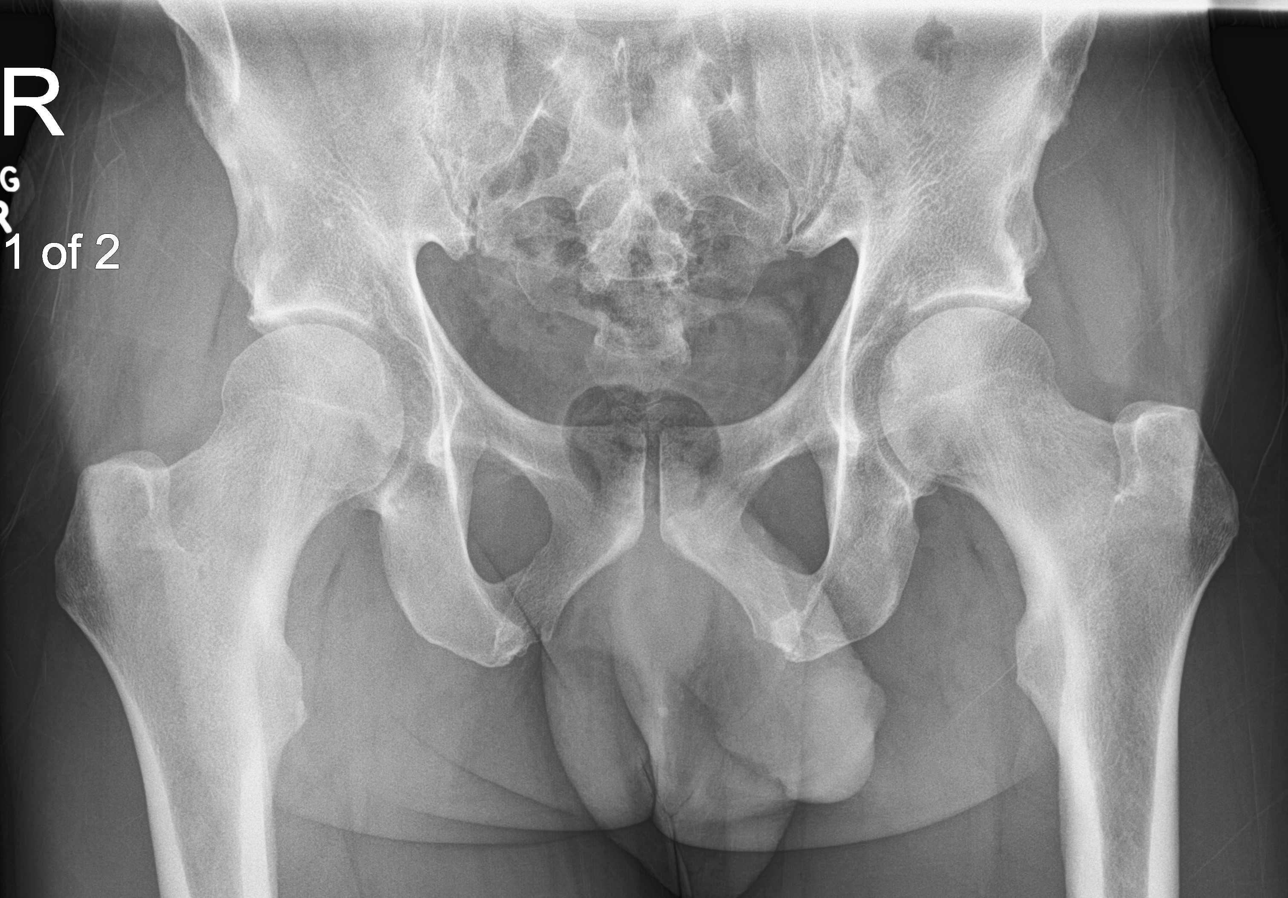
[im 3/4]
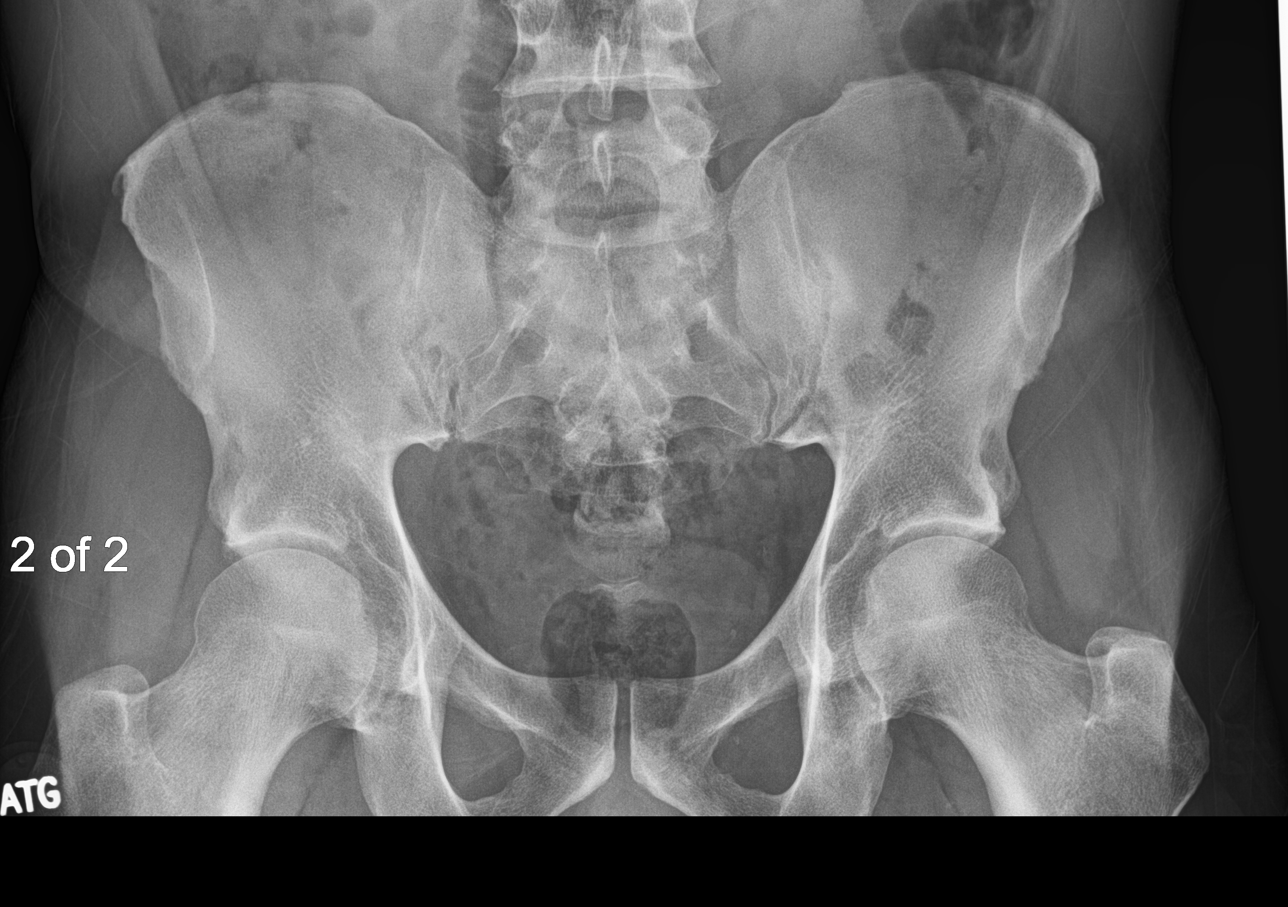
[im 4/4]
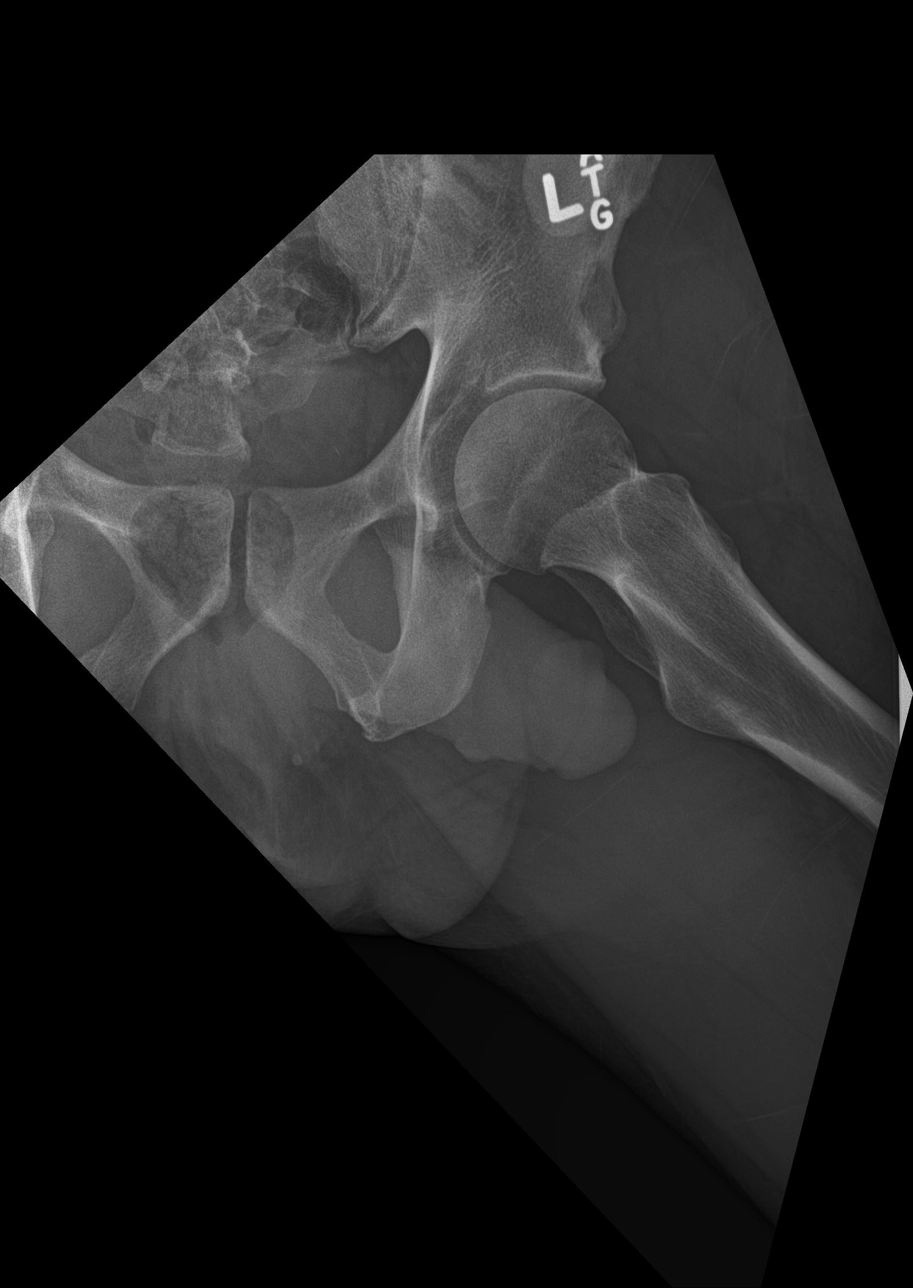

[4 of 4 positions shown; findings below may reference images not displayed]

FINDINGS: There is no evidence of hip fracture or dislocation. There is no
evidence of arthropathy or other focal bone abnormality.
IMPRESSION: Negative.

## 2016-09-09 ENCOUNTER — Other Ambulatory Visit: Payer: Self-pay | Admitting: Family Medicine

## 2016-10-12 ENCOUNTER — Other Ambulatory Visit: Payer: Self-pay | Admitting: Family Medicine

## 2016-10-16 ENCOUNTER — Other Ambulatory Visit: Payer: Self-pay | Admitting: Family Medicine

## 2016-10-18 DIAGNOSIS — E1165 Type 2 diabetes mellitus with hyperglycemia: Secondary | ICD-10-CM | POA: Diagnosis not present

## 2016-10-18 DIAGNOSIS — Z794 Long term (current) use of insulin: Secondary | ICD-10-CM | POA: Diagnosis not present

## 2016-10-20 ENCOUNTER — Ambulatory Visit: Payer: 59 | Admitting: Family Medicine

## 2016-10-25 ENCOUNTER — Ambulatory Visit (INDEPENDENT_AMBULATORY_CARE_PROVIDER_SITE_OTHER): Payer: 59 | Admitting: Family Medicine

## 2016-10-25 ENCOUNTER — Encounter: Payer: Self-pay | Admitting: Family Medicine

## 2016-10-25 VITALS — BP 124/80 | HR 83 | Temp 97.6°F | Resp 16 | Ht 72.0 in | Wt 199.4 lb

## 2016-10-25 DIAGNOSIS — E119 Type 2 diabetes mellitus without complications: Secondary | ICD-10-CM

## 2016-10-25 DIAGNOSIS — Z5181 Encounter for therapeutic drug level monitoring: Secondary | ICD-10-CM | POA: Diagnosis not present

## 2016-10-25 DIAGNOSIS — E785 Hyperlipidemia, unspecified: Secondary | ICD-10-CM

## 2016-10-25 DIAGNOSIS — Z794 Long term (current) use of insulin: Secondary | ICD-10-CM | POA: Diagnosis not present

## 2016-10-25 DIAGNOSIS — J3089 Other allergic rhinitis: Secondary | ICD-10-CM

## 2016-10-25 DIAGNOSIS — Z23 Encounter for immunization: Secondary | ICD-10-CM

## 2016-10-25 DIAGNOSIS — J309 Allergic rhinitis, unspecified: Secondary | ICD-10-CM | POA: Insufficient documentation

## 2016-10-25 DIAGNOSIS — Z720 Tobacco use: Secondary | ICD-10-CM

## 2016-10-25 DIAGNOSIS — I10 Essential (primary) hypertension: Secondary | ICD-10-CM

## 2016-10-25 LAB — COMPLETE METABOLIC PANEL WITH GFR
ALBUMIN: 4.2 g/dL (ref 3.6–5.1)
ALK PHOS: 61 U/L (ref 40–115)
ALT: 13 U/L (ref 9–46)
AST: 14 U/L (ref 10–40)
BUN: 11 mg/dL (ref 7–25)
CALCIUM: 9.4 mg/dL (ref 8.6–10.3)
CO2: 29 mmol/L (ref 20–31)
CREATININE: 1.14 mg/dL (ref 0.60–1.35)
Chloride: 105 mmol/L (ref 98–110)
GFR, Est African American: 89 mL/min (ref >=60)
GFR, Est Non African American: 77 mL/min (ref >=60)
Glucose, Bld: 230 mg/dL — ABNORMAL HIGH (ref 65–99)
Potassium: 4.1 mmol/L (ref 3.5–5.3)
Sodium: 141 mmol/L (ref 135–146)
TOTAL PROTEIN: 7.1 g/dL (ref 6.1–8.1)
Total Bilirubin: 0.3 mg/dL (ref 0.2–1.2)

## 2016-10-25 LAB — LIPID PANEL
CHOL/HDL RATIO: 3.5 ratio (ref <5.0)
Cholesterol: 131 mg/dL (ref <200)
HDL: 37 mg/dL — ABNORMAL LOW (ref >40)
LDL Cholesterol: 72 mg/dL (ref <100)
Triglycerides: 108 mg/dL (ref <150)
VLDL: 22 mg/dL (ref <30)

## 2016-10-25 MED ORDER — FLUTICASONE PROPIONATE 50 MCG/ACT NA SUSP: NASAL | 12 refills | Status: AC

## 2016-10-25 MED ORDER — OMEPRAZOLE 20 MG PO CPDR: 20.0000 mg | DELAYED_RELEASE_CAPSULE | Freq: Every day | ORAL | 3 refills | Status: DC

## 2016-10-25 NOTE — Assessment & Plan Note (Signed)
Return for fasting labs.

## 2016-10-25 NOTE — Progress Notes (Signed)
BP 124/80 (BP Location: Left Arm, Patient Position: Sitting, Cuff Size: Normal)   Pulse 83   Temp 97.6 F (36.4 C) (Oral)   Resp 16   Ht 6' (1.829 m)   Wt 199 lb 6 oz (90.4 kg)   SpO2 95%   BMI 27.04 kg/m    Subjective:    Patient ID: Isaiah Northern., male    DOB: 1971-02-16, 45 y.o.   MRN: [id number]  HPI: Isaiah Carraher. is a 45 y.o. male  Chief Complaint  Patient presents with  . Follow-up    non fasting labs  . Medication Refill    nasal spray    No medical excitement since last visit  Diabetes mellitus, managed by endocrinologist; A1c went up from 7 out 8; checking his own sugars; craves sweets at night, higher in the AM; started Jardiance yesterday; fasting went from 230 to 167; no problems with feet; last eye appt yesterday; flu shot today  HTN; well-controlled; not checking; does add salt; not using decongestants  Having a cough in the mornings, little raspy in the mornings; goes on for 30 minutes; brings up a little phlegm; nose running; no air vents; no pets; 2-3 pillows not goose down; carpet  High cholesterol; room for improvement; homemade sausage; not many eggs, 4 a week on the weekends; no bad muscle aches  GERD; been doing well  Depression screen [facility] 2/9 10/25/2016 04/20/2016 12/02/2015 09/02/2015  Decreased Interest 0 0 0 0  Down, Depressed, Hopeless 0 0 0 0  PHQ - 2 Score 0 0 0 0   Relevant past medical, surgical, family and social history reviewed Past Medical History:  Diagnosis Date  . Allergy   . Diabetes mellitus without complication ([place])   . Dysphagia   . Glaucoma 04/20/2016  . H/O alcohol abuse   . Hypertension   . Lipoma of forehead   . Pancreatitis   . Tobacco abuse 04/20/2016   History reviewed. No pertinent surgical history. Family History  Problem Relation Age of Onset  . Diabetes Mother   . Emphysema Father    Social History  Substance Use Topics  . Smoking status: Current Every Day Smoker    Packs/day: 1.00   Years: 14.00    Types: Cigarettes  . Smokeless tobacco: Never Used     Comment: patient states that he has been trying.  . Alcohol use No   Interim medical history since last visit reviewed. Allergies and medications reviewed  Review of Systems Per HPI unless specifically indicated above     Objective:    BP 124/80 (BP Location: Left Arm, Patient Position: Sitting, Cuff Size: Normal)   Pulse 83   Temp 97.6 F (36.4 C) (Oral)   Resp 16   Ht 6' (1.829 m)   Wt 199 lb 6 oz (90.4 kg)   SpO2 95%   BMI 27.04 kg/m   Wt Readings from Last 3 Encounters:  10/25/16 199 lb 6 oz (90.4 kg)  05/25/16 200 lb (90.7 kg)  05/02/16 201 lb (91.2 kg)    Physical Exam  Constitutional: He appears well-developed and well-nourished. No distress.  HENT:  Head: Atraumatic.  Eyes: EOM are normal. No scleral icterus.  Neck: No thyromegaly present.  Cardiovascular: Normal rate and regular rhythm.   Pulmonary/Chest: Effort normal and breath sounds normal.  Abdominal: Soft. Bowel sounds are normal. He exhibits no distension.  Musculoskeletal: He exhibits no edema.  Neurological: He is alert.  Skin: Skin is warm  and dry. No pallor.  Psychiatric: He has a normal mood and affect. His behavior is normal. Judgment and thought content normal. His mood appears not anxious. He does not exhibit a depressed mood.   Diabetic Foot Form - Detailed   Diabetic Foot Exam - detailed Diabetic Foot exam was performed with the following findings:  Yes 11/02/2016  7:30 PM  Visual Foot Exam completed.:  Yes  Are the toenails ingrown?:  No Normal Range of Motion:  Yes Pulse Foot Exam completed.:  Yes  Right Dorsalis Pedis:  Present Left Dorsalis Pedis:  Present  Sensory Foot Exam Completed.:  Yes Semmes-Weinstein Monofilament Test R Site 1-Great Toe:  Pos L Site 1-Great Toe:  Pos  R Site 4:  Pos L Site 4:  Pos  R Site 5:  Pos L Site 5:  Pos          Assessment & Plan:   Problem List Items Addressed This Visit       Cardiovascular and Mediastinum   Essential hypertension, benign - Primary    Well-controlled, but would add ACE-I or ARB for renal protection; call placed to endo to find out why he's not on one already (intolerance, hx of hyperkalemia with same, etc.); waiting on call back        Respiratory   Allergic rhinitis    nasal spray, avoid triggers        Endocrine   Diabetes mellitus type II, controlled, with no complications ([place])    I personally called endo, tried to find out why not on ACE-I or ARB; left message; waiting on response; checked feet today (by MD); eye exam UTD; A1c per endo      Relevant Orders   Lipid panel (Completed)     Other   Tobacco abuse    encoruagement to quit; I am here to help if/when ready      Medication monitoring encounter    Return for fasting labs      Relevant Orders   COMPLETE METABOLIC PANEL WITH GFR (Completed)   Dyslipidemia    Return for fasting labs; ideal LDL under 100 at least, under 70 would be excellent; goal TG under 150, goal HDL >40       Other Visit Diagnoses    Needs flu shot       Relevant Orders   Flu Vaccine QUAD 36+ mos PF IM (Fluarix & Fluzone Quad PF) (Completed)      Follow up plan: Return in about 6 months (around 04/25/2017) for follow-up.  An after-visit summary was printed and given to the patient at [facility].  Please see the patient instructions which may contain other information and recommendations beyond what is mentioned above in the assessment and plan.  Meds ordered this encounter  Medications  . fluticasone (FLONASE) 50 MCG/ACT nasal spray    Sig: USE 1 SPRAY IN EACH NOSTRIL 2 TIMES DAILY    Dispense:  16 g    Refill:  12  . omeprazole (PRILOSEC) 20 MG capsule    Sig: Take 1 capsule (20 mg total) by mouth daily.    Dispense:  30 capsule    Refill:  3    New lower dose    Orders Placed This Encounter  Procedures  . Flu Vaccine QUAD 36+ mos PF IM (Fluarix & Fluzone Quad PF)  . COMPLETE  METABOLIC PANEL WITH GFR  . Lipid panel

## 2016-10-25 NOTE — Assessment & Plan Note (Signed)
nasal spray, avoid triggers

## 2016-10-25 NOTE — Patient Instructions (Addendum)
I do encourage you to quit smoking Call [phone] to sign up for smoking cessation classes You can call 1-800-QUIT-NOW to talk with a smoking cessation coach Return for fasting labs soon Try to limit saturated fats in your diet (bologna, hot dogs, barbeque, cheeseburgers, hamburgers, steak, bacon, sausage, cheese, etc.) and get more fresh fruits, vegetables, and whole grains   Steps to Quit Smoking Smoking tobacco can be bad for your health. It can also affect almost every organ in your body. Smoking puts you and people around you at risk for many serious long-lasting (chronic) diseases. Quitting smoking is hard, but it is one of the best things that you can do for your health. It is never too late to quit. What are the benefits of quitting smoking? When you quit smoking, you lower your risk for getting serious diseases and conditions. They can include:  Lung cancer or lung disease.  Heart disease.  Stroke.  Heart attack.  Not being able to have children (infertility).  Weak bones (osteoporosis) and broken bones (fractures). If you have coughing, wheezing, and shortness of breath, those symptoms may get better when you quit. You may also get sick less often. If you are pregnant, quitting smoking can help to lower your chances of having a baby of low birth weight. What can I do to help me quit smoking? Talk with your doctor about what can help you quit smoking. Some things you can do (strategies) include:  Quitting smoking totally, instead of slowly cutting back how much you smoke over a period of time.  Going to in-person counseling. You are more likely to quit if you go to many counseling sessions.  Using resources and support systems, such as:  Online chats with a Social worker.  Phone quitlines.  Printed Furniture conservator/restorer.  Support groups or group counseling.  Text messaging programs.  Mobile phone apps or applications.  Taking medicines. Some of these medicines may  have nicotine in them. If you are pregnant or breastfeeding, do not take any medicines to quit smoking unless your doctor says it is okay. Talk with your doctor about counseling or other things that can help you. Talk with your doctor about using more than one strategy at the same time, such as taking medicines while you are also going to in-person counseling. This can help make quitting easier. What things can I do to make it easier to quit? Quitting smoking might feel very hard at first, but there is a lot that you can do to make it easier. Take these steps:  Talk to your family and friends. Ask them to support and encourage you.  Call phone quitlines, reach out to support groups, or work with a Social worker.  Ask people who smoke to not smoke around you.  Avoid places that make you want (trigger) to smoke, such as:  Bars.  Parties.  Smoke-break areas at work.  Spend time with people who do not smoke.  Lower the stress in your life. Stress can make you want to smoke. Try these things to help your stress:  Getting regular exercise.  Deep-breathing exercises.  Yoga.  Meditating.  Doing a body scan. To do this, close your eyes, focus on one area of your body at a time from head to toe, and notice which parts of your body are tense. Try to relax the muscles in those areas.  Download or buy apps on your mobile phone or tablet that can help you stick to your quit plan. There  are many free apps, such as QuitGuide from the [employer] Office manager for Disease Control and Prevention). You can find more support from smokefree.gov and other websites. This information is not intended to replace advice given to you by your health care provider. Make sure you discuss any questions you have with your health care provider. Document Released: 09/03/2009 Document Revised: 07/05/2016 Document Reviewed: 03/24/2015 Elsevier Interactive Patient Education  2017 [employer].

## 2016-10-25 NOTE — Assessment & Plan Note (Signed)
encoruagement to quit; I am here to help if/when ready

## 2016-10-25 NOTE — Assessment & Plan Note (Addendum)
I personally called endo, tried to find out why not on ACE-I or ARB; left message; waiting on response; checked feet today (by MD); eye exam UTD; A1c per endo

## 2016-11-02 NOTE — Assessment & Plan Note (Signed)
Well-controlled, but would add ACE-I or ARB for renal protection; call placed to endo to find out why he's not on one already (intolerance, hx of hyperkalemia with same, etc.); waiting on call back

## 2016-11-02 NOTE — Assessment & Plan Note (Signed)
Return for fasting labs; ideal LDL under 100 at least, under 70 would be excellent; goal TG under 150, goal HDL >40

## 2016-12-04 ENCOUNTER — Other Ambulatory Visit: Payer: Self-pay | Admitting: Family Medicine

## 2016-12-06 DIAGNOSIS — Z794 Long term (current) use of insulin: Secondary | ICD-10-CM | POA: Diagnosis not present

## 2016-12-06 DIAGNOSIS — E1165 Type 2 diabetes mellitus with hyperglycemia: Secondary | ICD-10-CM | POA: Diagnosis not present

## 2016-12-07 NOTE — Telephone Encounter (Signed)
Last Cr reviewed; Rx approved 

## 2016-12-09 ENCOUNTER — Other Ambulatory Visit: Payer: Self-pay | Admitting: Family Medicine

## 2016-12-09 NOTE — Telephone Encounter (Signed)
Rx for meloxicam received; I just approved Rx on 12/07/16 Please resolve with pharmacy

## 2016-12-13 DIAGNOSIS — R809 Proteinuria, unspecified: Secondary | ICD-10-CM | POA: Diagnosis not present

## 2016-12-13 DIAGNOSIS — E1129 Type 2 diabetes mellitus with other diabetic kidney complication: Secondary | ICD-10-CM | POA: Diagnosis not present

## 2016-12-13 DIAGNOSIS — I10 Essential (primary) hypertension: Secondary | ICD-10-CM | POA: Diagnosis not present

## 2016-12-13 DIAGNOSIS — Z794 Long term (current) use of insulin: Secondary | ICD-10-CM | POA: Diagnosis not present

## 2017-01-20 ENCOUNTER — Other Ambulatory Visit: Payer: Self-pay

## 2017-01-20 MED ORDER — MELOXICAM 15 MG PO TABS: 15.0000 mg | ORAL_TABLET | Freq: Every day | ORAL | 1 refills | Status: DC | PRN

## 2017-01-20 NOTE — Telephone Encounter (Signed)
90 day supply

## 2017-01-24 ENCOUNTER — Other Ambulatory Visit: Payer: Self-pay

## 2017-01-24 MED ORDER — OMEPRAZOLE 20 MG PO CPDR: 20.0000 mg | DELAYED_RELEASE_CAPSULE | Freq: Every day | ORAL | 0 refills | Status: DC

## 2017-01-24 NOTE — Telephone Encounter (Signed)
Pt ins. Requires 90 day supply.

## 2017-01-24 NOTE — Telephone Encounter (Signed)
PPI Rx sent with cautions

## 2017-02-06 ENCOUNTER — Other Ambulatory Visit: Payer: Self-pay

## 2017-02-06 MED ORDER — ATORVASTATIN CALCIUM 40 MG PO TABS: 40.0000 mg | ORAL_TABLET | Freq: Every day | ORAL | 3 refills | Status: DC

## 2017-02-06 NOTE — Telephone Encounter (Signed)
Last sgpt and lipids reviewed; Rx approved 

## 2017-02-07 ENCOUNTER — Other Ambulatory Visit: Payer: Self-pay | Admitting: Family Medicine

## 2017-02-09 NOTE — Telephone Encounter (Signed)
Last Cr reviewed; will add note about taking at least 1 hour after aspirin

## 2017-03-07 DIAGNOSIS — E1129 Type 2 diabetes mellitus with other diabetic kidney complication: Secondary | ICD-10-CM | POA: Diagnosis not present

## 2017-03-07 DIAGNOSIS — Z794 Long term (current) use of insulin: Secondary | ICD-10-CM | POA: Diagnosis not present

## 2017-03-07 DIAGNOSIS — R809 Proteinuria, unspecified: Secondary | ICD-10-CM | POA: Diagnosis not present

## 2017-03-20 DIAGNOSIS — H40153 Residual stage of open-angle glaucoma, bilateral: Secondary | ICD-10-CM | POA: Diagnosis not present

## 2017-04-19 DIAGNOSIS — Z79899 Other long term (current) drug therapy: Secondary | ICD-10-CM | POA: Diagnosis not present

## 2017-04-19 DIAGNOSIS — I10 Essential (primary) hypertension: Secondary | ICD-10-CM | POA: Diagnosis not present

## 2017-04-19 DIAGNOSIS — E1165 Type 2 diabetes mellitus with hyperglycemia: Secondary | ICD-10-CM | POA: Diagnosis not present

## 2017-04-19 DIAGNOSIS — N529 Male erectile dysfunction, unspecified: Secondary | ICD-10-CM | POA: Diagnosis not present

## 2017-04-25 ENCOUNTER — Ambulatory Visit: Payer: BLUE CROSS/BLUE SHIELD | Admitting: Family Medicine

## 2017-04-28 ENCOUNTER — Encounter: Payer: Self-pay | Admitting: Family Medicine

## 2017-04-28 ENCOUNTER — Ambulatory Visit (INDEPENDENT_AMBULATORY_CARE_PROVIDER_SITE_OTHER): Payer: BLUE CROSS/BLUE SHIELD | Admitting: Family Medicine

## 2017-04-28 VITALS — BP 122/84 | HR 73 | Temp 98.2°F | Resp 14 | Ht 72.0 in | Wt 195.2 lb

## 2017-04-28 DIAGNOSIS — Z23 Encounter for immunization: Secondary | ICD-10-CM | POA: Diagnosis not present

## 2017-04-28 DIAGNOSIS — Z794 Long term (current) use of insulin: Secondary | ICD-10-CM

## 2017-04-28 DIAGNOSIS — I10 Essential (primary) hypertension: Secondary | ICD-10-CM

## 2017-04-28 DIAGNOSIS — Z720 Tobacco use: Secondary | ICD-10-CM | POA: Diagnosis not present

## 2017-04-28 DIAGNOSIS — E119 Type 2 diabetes mellitus without complications: Secondary | ICD-10-CM | POA: Diagnosis not present

## 2017-04-28 DIAGNOSIS — E785 Hyperlipidemia, unspecified: Secondary | ICD-10-CM

## 2017-04-28 NOTE — Progress Notes (Signed)
BP 122/84 (BP Location: Right Arm, Patient Position: Sitting, Cuff Size: Large)   Pulse 73   Temp 98.2 F (36.8 C) (Oral)   Resp 14   Ht 6' (1.829 m)   Wt 195 lb 3.2 oz (88.5 kg)   SpO2 97%   BMI 26.47 kg/m    Subjective:    Patient ID: Isaiah Stout., male    DOB: December 14, 1970, 46 y.o.   MRN: [id number]  HPI: Isaiah Stout. is a 46 y.o. male  Chief Complaint  Patient presents with  . Medication Refill    6 month F/U  . Hypertension  . Gastroesophageal Reflux    HPI Patient is here for 6 months f/u Since last visit, no medical excitement   Hypertension; well-controlled; not checking away from the doctor; not avoiding salt; rarely uses decongestants  GERD; off of pills, being controlled; BBQ might flare him up  Type 2 DM; not on ACE-I or ARB, A1c per endo; avoiding sugary drinks for the most part except when calories are needed; no problems with eyes or feet  He gets flu shots every fall; due for PPSV-23 and tetanus booster  Depression screen [facility] 2/9 04/28/2017 10/25/2016 04/20/2016 12/02/2015 09/02/2015  Decreased Interest 0 0 0 0 0  Down, Depressed, Hopeless 0 0 0 0 0  PHQ - 2 Score 0 0 0 0 0   Relevant past medical, surgical, family and social history reviewed Past Medical History:  Diagnosis Date  . Allergy   . Diabetes mellitus without complication ([place])   . Dysphagia   . Glaucoma 04/20/2016  . H/O alcohol abuse   . Hypertension   . Lipoma of forehead   . Pancreatitis   . Tobacco abuse 04/20/2016   Past Surgical History:  Procedure Laterality Date  . LESION REMOVAL Left 2017   Forehead   Family History  Problem Relation Age of Onset  . Diabetes Mother   . Emphysema Father   . Diabetes Maternal Grandmother   . Hypertension Maternal Grandmother   . Diabetes Maternal Grandfather   . Hypertension Maternal Grandfather    Social History   Social History  . Marital status: Married    Spouse name: N/A  . Number of children: N/A  . Years of  education: N/A   Occupational History  . Not on file.   Social History Main Topics  . Smoking status: Current Every Day Smoker    Packs/day: 1.00    Years: 30.00    Types: Cigarettes    Start date: 11/21/1985  . Smokeless tobacco: Never Used     Comment: patient states that he has been trying.  . Alcohol use No  . Drug use: No  . Sexual activity: Yes    Partners: Female    Birth control/ protection: Condom   Other Topics Concern  . Not on file   Social History Narrative  . No narrative on file  MD note: he knows smoking is bad for him; not ready to quit  Interim medical history since last visit reviewed. Allergies and medications reviewed  Review of Systems Per HPI unless specifically indicated above     Objective:    BP 122/84 (BP Location: Right Arm, Patient Position: Sitting, Cuff Size: Large)   Pulse 73   Temp 98.2 F (36.8 C) (Oral)   Resp 14   Ht 6' (1.829 m)   Wt 195 lb 3.2 oz (88.5 kg)   SpO2 97%   BMI 26.47 kg/m  Wt Readings from Last 3 Encounters:  04/28/17 195 lb 3.2 oz (88.5 kg)  10/25/16 199 lb 6 oz (90.4 kg)  05/25/16 200 lb (90.7 kg)    Physical Exam  Constitutional: He appears well-developed and well-nourished. No distress.  HENT:  Head: Atraumatic.  Eyes: EOM are normal. No scleral icterus.  Neck: No thyromegaly present.  Cardiovascular: Normal rate and regular rhythm.   Pulmonary/Chest: Effort normal and breath sounds normal.  Abdominal: Soft. Bowel sounds are normal. He exhibits no distension.  Musculoskeletal: He exhibits no edema.  Neurological: He is alert.  Skin: Skin is warm and dry. No pallor.  Psychiatric: He has a normal mood and affect. His behavior is normal. Judgment and thought content normal. His mood appears not anxious. He does not exhibit a depressed mood.   Diabetic Foot Form - Detailed   Diabetic Foot Exam - detailed Diabetic Foot exam was performed with the following findings:  Yes 05/03/2017  8:10 AM  Visual Foot  Exam completed.:  Yes  Are the toenails ingrown?:  No Normal Range of Motion:  Yes Pulse Foot Exam completed.:  Yes  Right Dorsalis Pedis:  Present Left Dorsalis Pedis:  Present  Sensory Foot Exam Completed.:  Yes Swelling:  No Semmes-Weinstein Monofilament Test R Site 1-Great Toe:  Pos L Site 1-Great Toe:  Pos  R Site 4:  Pos L Site 4:  Pos  R Site 5:  Pos L Site 5:  Pos        Results for orders placed or performed in visit on 10/25/16  COMPLETE METABOLIC PANEL WITH GFR  Result Value Ref Range   Sodium 141 135 - 146 mmol/L   Potassium 4.1 3.5 - 5.3 mmol/L   Chloride 105 98 - 110 mmol/L   CO2 29 20 - 31 mmol/L   Glucose, Bld 230 (H) 65 - 99 mg/dL   BUN 11 7 - 25 mg/dL   Creat 1.14 0.60 - 1.35 mg/dL   Total Bilirubin 0.3 0.2 - 1.2 mg/dL   Alkaline Phosphatase 61 40 - 115 U/L   AST 14 10 - 40 U/L   ALT 13 9 - 46 U/L   Total Protein 7.1 6.1 - 8.1 g/dL   Albumin 4.2 3.6 - 5.1 g/dL   Calcium 9.4 8.6 - 10.3 mg/dL   GFR, Est African American 89 >=60 mL/min   GFR, Est Non African American 77 >=60 mL/min  Lipid panel  Result Value Ref Range   Cholesterol 131 <200 mg/dL   Triglycerides 108 <150 mg/dL   HDL 37 (L) >40 mg/dL   Total CHOL/HDL Ratio 3.5 <5.0 Ratio   VLDL 22 <30 mg/dL   LDL Cholesterol 72 <100 mg/dL      Assessment & Plan:   Problem List Items Addressed This Visit      Cardiovascular and Mediastinum   Essential hypertension, benign - Primary    controlled      Relevant Medications   sildenafil (VIAGRA) 100 MG tablet     Endocrine   Diabetes mellitus type II, controlled, with no complications ([place])    Managed by endocrinologist; foot exam by MD today      Relevant Medications   JARDIANCE 25 MG TABS tablet     Other   Tobacco abuse    Patient is not ready to quit today; I am here to help if/when ready      Dyslipidemia    Check today; goal LDL at least less than 100; goal HDL >40;  limit saturated fats       Other Visit Diagnoses    Need for  diphtheria-tetanus-pertussis (Tdap) vaccine       Relevant Orders   Tdap vaccine greater than or equal to 7yo IM (Completed)   Need for 23-polyvalent pneumococcal polysaccharide vaccine       Relevant Orders   Pneumococcal polysaccharide vaccine 23-valent greater than or equal to 2yo subcutaneous/IM (Completed)       Follow up plan: Return in about 6 months (around 10/28/2017) for follow-up visit with Dr. Sanda Klein.  An after-visit summary was printed and given to the patient at [place].  Please see the patient instructions which may contain other information and recommendations beyond what is mentioned above in the assessment and plan.  Meds ordered this encounter  Medications  . JARDIANCE 25 MG TABS tablet    Sig: Take 25 mg by mouth daily.    Refill:  6  . sildenafil (VIAGRA) 100 MG tablet    Sig: Take 1 tablet 30-45 minutes prior to anticipated sexual activity once daily as needed.    Orders Placed This Encounter  Procedures  . Tdap vaccine greater than or equal to 7yo IM  . Pneumococcal polysaccharide vaccine 23-valent greater than or equal to 2yo subcutaneous/IM

## 2017-04-28 NOTE — Assessment & Plan Note (Signed)
controlled 

## 2017-04-28 NOTE — Patient Instructions (Addendum)
Try to use PLAIN allergy medicine without the decongestant Avoid: phenylephrine, phenylpropanolamine, and pseudoephredine Try to follow the DASH guidelines (DASH stands for Dietary Approaches to Stop Hypertension) Try to limit the sodium in your diet.  Ideally, consume less than 1.5 grams (less than 1,500mg ) per day. Do not add salt when cooking or at the table.  Check the sodium amount on labels when shopping, and choose items lower in sodium when given a choice. Avoid or limit foods that already contain a lot of sodium. Eat a diet rich in fruits and vegetables and whole grains. You received the vaccine to protect against tetanus and diphtheria and pertussis today; the tetanus and diphtheria portions will provide protection up to ten years, and the pertussis component will give you protection against whooping cough for life You have received the Pneumovax vaccine (PPSV-23) and you will not need another booster of this until after you turn 47 years old Please do ask your diabetes doctor about a low dose medicine to help protect your kidneys (losartan, lisinopril, etc)

## 2017-04-28 NOTE — Assessment & Plan Note (Addendum)
Managed by endocrinologist; foot exam by MD today 

## 2017-04-28 NOTE — Assessment & Plan Note (Addendum)
Check today; goal LDL at least less than 100; goal HDL >40; limit saturated fats

## 2017-05-03 NOTE — Assessment & Plan Note (Signed)
Patient is not ready to quit today; I am here to help if/when ready

## 2017-05-12 ENCOUNTER — Other Ambulatory Visit: Payer: Self-pay | Admitting: Family Medicine

## 2017-05-12 NOTE — Telephone Encounter (Signed)
Patient informed me at last visit that he was no longer taking PPI; refill request received, may be auto-refill; DENIED Please call patient about PPI request; is stomach bothering him? Can we try zantac Rx instead? thanks

## 2017-05-12 NOTE — Telephone Encounter (Signed)
Left detailed voicemail

## 2017-05-16 ENCOUNTER — Other Ambulatory Visit: Payer: Self-pay | Admitting: Family Medicine

## 2017-06-02 ENCOUNTER — Other Ambulatory Visit: Payer: Self-pay | Admitting: Family Medicine

## 2017-06-02 NOTE — Telephone Encounter (Signed)
Last SGPT and lipids reviewed Rx approved 

## 2017-06-29 ENCOUNTER — Other Ambulatory Visit: Payer: Self-pay | Admitting: Family Medicine

## 2017-06-30 NOTE — Telephone Encounter (Signed)
Please let pt know about the risks of using the PPI long-term Blocking acid in the stomach causes less absorption of certain elements, so he'll absorb less calcium, less iron, less magnesium This can put him at risk for anemia, osteoporosis, heart rhythm abnormalities, cramps It can also increase his risk of developing pneumonia and a type of colitis that can be serious, if not fatal Try to avoid trigger foods, if he can use Zantac instead, that is not associated with those dangers

## 2017-06-30 NOTE — Telephone Encounter (Signed)
Patient notified

## 2017-07-25 DIAGNOSIS — H0014 Chalazion left upper eyelid: Secondary | ICD-10-CM | POA: Diagnosis not present

## 2017-08-02 DIAGNOSIS — E1165 Type 2 diabetes mellitus with hyperglycemia: Secondary | ICD-10-CM | POA: Diagnosis not present

## 2017-08-02 DIAGNOSIS — Z794 Long term (current) use of insulin: Secondary | ICD-10-CM | POA: Diagnosis not present

## 2017-08-07 DIAGNOSIS — H40153 Residual stage of open-angle glaucoma, bilateral: Secondary | ICD-10-CM | POA: Diagnosis not present

## 2017-08-09 DIAGNOSIS — E1169 Type 2 diabetes mellitus with other specified complication: Secondary | ICD-10-CM | POA: Diagnosis not present

## 2017-08-09 DIAGNOSIS — E1165 Type 2 diabetes mellitus with hyperglycemia: Secondary | ICD-10-CM | POA: Diagnosis not present

## 2017-08-09 DIAGNOSIS — Z8719 Personal history of other diseases of the digestive system: Secondary | ICD-10-CM | POA: Diagnosis not present

## 2017-08-09 DIAGNOSIS — E785 Hyperlipidemia, unspecified: Secondary | ICD-10-CM

## 2017-08-09 DIAGNOSIS — Z794 Long term (current) use of insulin: Secondary | ICD-10-CM | POA: Diagnosis not present

## 2017-09-14 ENCOUNTER — Other Ambulatory Visit: Payer: Self-pay | Admitting: Family Medicine

## 2017-09-14 NOTE — Telephone Encounter (Signed)
Patient should not run out of cholesterol medicine until early December Please resolve with pharmacy, [facility] Thank you

## 2017-10-30 ENCOUNTER — Ambulatory Visit: Payer: BLUE CROSS/BLUE SHIELD | Admitting: Family Medicine

## 2017-11-10 ENCOUNTER — Ambulatory Visit: Payer: BLUE CROSS/BLUE SHIELD | Admitting: Family Medicine

## 2017-11-10 ENCOUNTER — Other Ambulatory Visit: Payer: Self-pay

## 2017-11-10 ENCOUNTER — Encounter: Payer: Self-pay | Admitting: Family Medicine

## 2017-11-10 VITALS — BP 122/64 | HR 87 | Temp 97.9°F | Ht 72.0 in | Wt 195.5 lb

## 2017-11-10 DIAGNOSIS — E785 Hyperlipidemia, unspecified: Secondary | ICD-10-CM | POA: Diagnosis not present

## 2017-11-10 DIAGNOSIS — E119 Type 2 diabetes mellitus without complications: Secondary | ICD-10-CM | POA: Diagnosis not present

## 2017-11-10 DIAGNOSIS — Z720 Tobacco use: Secondary | ICD-10-CM

## 2017-11-10 DIAGNOSIS — Z794 Long term (current) use of insulin: Secondary | ICD-10-CM | POA: Diagnosis not present

## 2017-11-10 DIAGNOSIS — I10 Essential (primary) hypertension: Secondary | ICD-10-CM

## 2017-11-10 DIAGNOSIS — R1314 Dysphagia, pharyngoesophageal phase: Secondary | ICD-10-CM

## 2017-11-10 DIAGNOSIS — Z23 Encounter for immunization: Secondary | ICD-10-CM | POA: Diagnosis not present

## 2017-11-10 DIAGNOSIS — E1169 Type 2 diabetes mellitus with other specified complication: Secondary | ICD-10-CM | POA: Diagnosis not present

## 2017-11-10 DIAGNOSIS — Z1211 Encounter for screening for malignant neoplasm of colon: Secondary | ICD-10-CM | POA: Diagnosis not present

## 2017-11-10 DIAGNOSIS — Z5181 Encounter for therapeutic drug level monitoring: Secondary | ICD-10-CM

## 2017-11-10 MED ORDER — OMEPRAZOLE 20 MG PO CPDR: 20.0000 mg | DELAYED_RELEASE_CAPSULE | Freq: Every day | ORAL | 0 refills | Status: DC

## 2017-11-10 NOTE — Assessment & Plan Note (Signed)
Order Cologuard?

## 2017-11-10 NOTE — Progress Notes (Signed)
BP 122/64 (BP Location: Left Arm, Patient Position: Sitting, Cuff Size: Large)   Pulse 87   Temp 97.9 F (36.6 C) (Oral)   Ht 6' (1.829 m)   Wt 195 lb 8 oz (88.7 kg)   SpO2 96%   BMI 26.51 kg/m    Subjective:    Patient ID: Isaiah Northern., male    DOB: [birth year], 46 y.o.   MRN: [id number]  HPI: Isaiah Stout. is a 46 y.o. male  Chief Complaint  Patient presents with  . Follow-up  . Diabetes    HPI Here for f/u No medical excitement since last visit Type 2 dm, checking sugars; no lows; switched to [employer] bottle water, got dry mouth; no blurred vision Goes to see eye doctor on Jan 21st No foot problems Taking aspirin 30 day average 184, 7 day average 185 172 this morning On insulin plus orals Aunts and uncles had diabetes Mother had diabetes  HTN; well-controlled  GERD; stuff gets hung when he swallows; off of omeprazole right now but would like to go back on; can even get hung up with water; no blood in stool; no pain with swallowing  Taking the meloxicam; no pain right now  High cholesterol; likes cheese; steak and beef, fatty meats; had lunch around 1 pm; double bacon cheeseburger  Depression screen [facility] 2/9 04/28/2017 10/25/2016 04/20/2016 12/02/2015 09/02/2015  Decreased Interest 0 0 0 0 0  Down, Depressed, Hopeless 0 0 0 0 0  PHQ - 2 Score 0 0 0 0 0    Relevant past medical, surgical, family and social history reviewed Past Medical History:  Diagnosis Date  . Allergy   . Diabetes mellitus without complication ([place])   . Dysphagia   . Glaucoma 04/20/2016  . H/O alcohol abuse   . Hypertension   . Lipoma of forehead   . Pancreatitis   . Tobacco abuse 04/20/2016   Past Surgical History:  Procedure Laterality Date  . LESION REMOVAL Left 2017   Forehead   Family History  Problem Relation Age of Onset  . Diabetes Mother   . Emphysema Father   . Diabetes Maternal Grandmother   . Hypertension Maternal Grandmother   . Diabetes Maternal  Grandfather   . Hypertension Maternal Grandfather    Social History   Tobacco Use  . Smoking status: Current Every Day Smoker    Packs/day: 1.00    Years: 30.00    Pack years: 30.00    Types: Cigarettes    Start date: 11/21/1985  . Smokeless tobacco: Never Used  . Tobacco comment: patient states that he has been trying.  Substance Use Topics  . Alcohol use: No    Alcohol/week: 0.0 oz  . Drug use: No  smoking same, not ready to quit  Interim medical history since last visit reviewed. Allergies and medications reviewed  Review of Systems Per HPI unless specifically indicated above     Objective:    BP 122/64 (BP Location: Left Arm, Patient Position: Sitting, Cuff Size: Large)   Pulse 87   Temp 97.9 F (36.6 C) (Oral)   Ht 6' (1.829 m)   Wt 195 lb 8 oz (88.7 kg)   SpO2 96%   BMI 26.51 kg/m   Wt Readings from Last 3 Encounters:  11/10/17 195 lb 8 oz (88.7 kg)  04/28/17 195 lb 3.2 oz (88.5 kg)  10/25/16 199 lb 6 oz (90.4 kg)    Physical Exam  Constitutional: He appears well-developed and  well-nourished. No distress.  HENT:  Head: Atraumatic.  Eyes: EOM are normal. No scleral icterus.  Neck: No thyromegaly present.  Cardiovascular: Normal rate and regular rhythm.  Pulmonary/Chest: Effort normal and breath sounds normal.  Abdominal: Soft. Bowel sounds are normal. He exhibits no distension.  Musculoskeletal: He exhibits no edema.  Neurological: He is alert.  Skin: Skin is warm. No pallor.  Psychiatric: He has a normal mood and affect. His mood appears not anxious. He does not exhibit a depressed mood.   Diabetic Foot Form - Detailed   Diabetic Foot Exam - detailed Diabetic Foot exam was performed with the following findings:  Yes 11/10/2017  3:35 PM  Visual Foot Exam completed.:  Yes  Pulse Foot Exam completed.:  Yes  Right Dorsalis Pedis:  Present Left Dorsalis Pedis:  Present  Sensory Foot Exam Completed.:  Yes Semmes-Weinstein Monofilament Test R Site 1-Great  Toe:  Pos L Site 1-Great Toe:  Pos          Assessment & Plan:   Problem List Items Addressed This Visit      Cardiovascular and Mediastinum   Essential hypertension, benign - Primary    BP is fabulous        Endocrine   Diabetes mellitus type II, controlled, with no complications ([facility])   Relevant Orders   Hemoglobin A1c   Hyperlipidemia due to type 2 diabetes mellitus ([place])    Check lipids today, nonfasting, try to limit fatty meats some; continue statin      Relevant Orders   Lipid panel (Completed)     Other   Tobacco abuse    Not ready to quit, I am here when ready      Medication monitoring encounter    Check labs      Relevant Orders   COMPLETE METABOLIC PANEL WITH GFR (Completed)   Colon cancer screening    Order Cologuard       Other Visit Diagnoses    Flu vaccine need       Relevant Orders   Flu Vaccine QUAD 36+ mos IM (Completed)   Screen for colon cancer       Relevant Orders   Cologuard   Pharyngoesophageal dysphagia       Relevant Orders   Ambulatory referral to Gastroenterology      Follow up plan: Return in about 6 months (around 05/11/2018) for twenty minute follow-up with fasting labs.  An after-visit summary was printed and given to the patient at [place].  Please see the patient instructions which may contain other information and recommendations beyond what is mentioned above in the assessment and plan.  Meds ordered this encounter  Medications  . omeprazole (PRILOSEC) 20 MG capsule    Sig: Take 1 capsule (20 mg total) by mouth daily. Caution:prolonged use may increase risk of pneumonia, colitis, osteoporosis, anemia    Dispense:  90 capsule    Refill:  0    Orders Placed This Encounter  Procedures  . Flu Vaccine QUAD 36+ mos IM  . Hemoglobin A1c  . Cologuard  . Lipid panel  . COMPLETE METABOLIC PANEL WITH GFR  . Hemoglobin A1c  . Ambulatory referral to Gastroenterology

## 2017-11-10 NOTE — Assessment & Plan Note (Signed)
Not ready to quit, I am here when ready

## 2017-11-10 NOTE — Assessment & Plan Note (Signed)
Check lipids today, nonfasting, try to limit fatty meats some; continue statin

## 2017-11-10 NOTE — Assessment & Plan Note (Signed)
Check labs 

## 2017-11-10 NOTE — Patient Instructions (Addendum)
Please do see the gastroenterologist Start back on the omeprazole Stop the meloxicam for now I am here to help if/when you are ready to quit smoking If you have not heard anything from my staff in a week about any orders/referrals/studies from today, please contact us here to follow-up [phone]

## 2017-11-10 NOTE — Assessment & Plan Note (Signed)
BP is fabulous

## 2017-11-11 ENCOUNTER — Encounter: Payer: Self-pay | Admitting: Family Medicine

## 2017-11-11 LAB — COMPLETE METABOLIC PANEL WITH GFR
AG Ratio: 1.6 (calc) (ref 1.0–2.5)
ALBUMIN MSPROF: 4.1 g/dL (ref 3.6–5.1)
ALT: 11 U/L (ref 9–46)
AST: 13 U/L (ref 10–40)
Alkaline phosphatase (APISO): 57 U/L (ref 40–115)
BUN/Creatinine Ratio: 12 (calc) (ref 6–22)
BUN: 16 mg/dL (ref 7–25)
CALCIUM: 9.7 mg/dL (ref 8.6–10.3)
CO2: 27 mmol/L (ref 20–32)
CREATININE: 1.38 mg/dL — AB (ref 0.60–1.35)
Chloride: 104 mmol/L (ref 98–110)
GFR, EST NON AFRICAN AMERICAN: 60 mL/min/{1.73_m2} (ref >=60)
GFR, Est African American: 70 mL/min/{1.73_m2} (ref >=60)
GLOBULIN: 2.6 g/dL (ref 1.9–3.7)
Glucose, Bld: 275 mg/dL — ABNORMAL HIGH (ref 65–99)
Potassium: 4.4 mmol/L (ref 3.5–5.3)
SODIUM: 138 mmol/L (ref 135–146)
Total Bilirubin: 0.3 mg/dL (ref 0.2–1.2)
Total Protein: 6.7 g/dL (ref 6.1–8.1)

## 2017-11-11 LAB — LIPID PANEL
CHOL/HDL RATIO: 3.3 (calc) (ref <5.0)
CHOLESTEROL: 111 mg/dL (ref <200)
HDL: 34 mg/dL — ABNORMAL LOW (ref >40)
LDL Cholesterol (Calc): 52 mg/dL (calc)
Non-HDL Cholesterol (Calc): 77 mg/dL (calc) (ref <130)
Triglycerides: 177 mg/dL — ABNORMAL HIGH (ref <150)

## 2017-11-11 LAB — HEMOGLOBIN A1C
HEMOGLOBIN A1C: 8.3 %{Hb} — AB (ref <5.7)
Mean Plasma Glucose: 192 (calc)
eAG (mmol/L): 10.6 (calc)

## 2017-11-13 ENCOUNTER — Telehealth: Payer: Self-pay

## 2017-11-13 DIAGNOSIS — N289 Disorder of kidney and ureter, unspecified: Secondary | ICD-10-CM

## 2017-11-13 NOTE — Telephone Encounter (Signed)
-----   Message from Arnetha Courser, MD sent at 11/13/2017  8:53 AM EST ----- Please let pt know that his LDL is super; his total cholesterol is at goal; his TG are a little elevated, and his HDL is a little low, so increased walking and better diabetes control will help those parts of the cholesterol panel His kidney function declined a bit; ask him to avoid NSAIDs, stay well-hydrated, and let's recheck his BMP in 1 month (please order, dx renal insufficiency); thank you He has really brought his A1c down; his A1c is 8.3; goal is less than 7, so encourage him to work with his endocrinologist on that; we wish him a Merry Christmas

## 2017-11-13 NOTE — Telephone Encounter (Signed)
Called pt informed him of information below, per Dr.Lada. Pt gave verbal understanding. Future order placed.

## 2017-11-23 DIAGNOSIS — E1159 Type 2 diabetes mellitus with other circulatory complications: Secondary | ICD-10-CM | POA: Diagnosis not present

## 2017-11-23 DIAGNOSIS — E1165 Type 2 diabetes mellitus with hyperglycemia: Secondary | ICD-10-CM | POA: Diagnosis not present

## 2017-11-23 DIAGNOSIS — Z794 Long term (current) use of insulin: Secondary | ICD-10-CM | POA: Diagnosis not present

## 2017-11-23 DIAGNOSIS — E1169 Type 2 diabetes mellitus with other specified complication: Secondary | ICD-10-CM | POA: Diagnosis not present

## 2017-11-30 ENCOUNTER — Ambulatory Visit: Payer: Self-pay | Admitting: Gastroenterology

## 2017-12-12 DIAGNOSIS — H40153 Residual stage of open-angle glaucoma, bilateral: Secondary | ICD-10-CM | POA: Diagnosis not present

## 2018-01-24 ENCOUNTER — Other Ambulatory Visit: Payer: Self-pay | Admitting: Family Medicine

## 2018-01-24 NOTE — Telephone Encounter (Signed)
Please call patient, remind him that we had hoped to see his kidney function in late January The meloxicam can harm the kidneys long-term, so I'm not going to be able to approve the meloxicam with the possible of kidney issues Please have him get the BMP Avoid NSAIDs, hydrate Use topicals (Biofreeze or other brand) and tylenol for aches and pains Thank you

## 2018-01-24 NOTE — Telephone Encounter (Signed)
Called pt informed him of the need to come by and labs done. Pt gave verbal understanding. States that he will come by on 01/26/18 after work.

## 2018-01-26 ENCOUNTER — Other Ambulatory Visit: Payer: Self-pay

## 2018-01-26 DIAGNOSIS — N289 Disorder of kidney and ureter, unspecified: Secondary | ICD-10-CM

## 2018-01-27 LAB — BASIC METABOLIC PANEL WITH GFR
BUN: 15 mg/dL (ref 7–25)
CO2: 26 mmol/L (ref 20–32)
CREATININE: 1.09 mg/dL (ref 0.60–1.35)
Calcium: 9.4 mg/dL (ref 8.6–10.3)
Chloride: 107 mmol/L (ref 98–110)
GFR, EST AFRICAN AMERICAN: 93 mL/min/{1.73_m2} (ref >=60)
GFR, EST NON AFRICAN AMERICAN: 80 mL/min/{1.73_m2} (ref >=60)
Glucose, Bld: 151 mg/dL — ABNORMAL HIGH (ref 65–99)
Potassium: 4.2 mmol/L (ref 3.5–5.3)
SODIUM: 141 mmol/L (ref 135–146)

## 2018-01-29 ENCOUNTER — Telehealth: Payer: Self-pay

## 2018-01-29 NOTE — Telephone Encounter (Signed)
Called pt no answer. LM for pt informing him of information below. Advised pt to call back for questions or concerns. CRM created.

## 2018-01-29 NOTE — Telephone Encounter (Signed)
-----   Message from Arnetha Courser, MD sent at 01/27/2018  8:50 AM EST ----- Please let the patient know that his kidney function is back to normal, and is actually as good as it was back in May 2017. Avoid NSAIDs, stay well-hydrated. Thank you

## 2018-02-11 ENCOUNTER — Other Ambulatory Visit: Payer: Self-pay | Admitting: Family Medicine

## 2018-02-12 MED ORDER — RANITIDINE HCL 300 MG PO TABS: 300.0000 mg | ORAL_TABLET | Freq: Every day | ORAL | 2 refills | Status: DC

## 2018-02-12 NOTE — Telephone Encounter (Signed)
Let patient know that I'd like him to stop the omeprazole and take ranitidine instead Avoid spicy foods, orange juice, acid foods like spaghetti sauce, pizza, green peppers, etc. Call with any problems

## 2018-02-12 NOTE — Telephone Encounter (Signed)
Left detailed voicemail

## 2018-02-15 ENCOUNTER — Other Ambulatory Visit: Payer: Self-pay | Admitting: Family Medicine

## 2018-02-15 NOTE — Telephone Encounter (Signed)
Please see 02/11/18 note Me       8:05 AM  Note    Let patient know that I'd like him to stop the omeprazole and take ranitidine instead Avoid spicy foods, orange juice, acid foods like spaghetti sauce, pizza, green peppers, etc. Call with any problems

## 2018-02-16 NOTE — Telephone Encounter (Signed)
Left detailed voicemial 

## 2018-02-22 DIAGNOSIS — E1165 Type 2 diabetes mellitus with hyperglycemia: Secondary | ICD-10-CM | POA: Diagnosis not present

## 2018-02-22 DIAGNOSIS — Z794 Long term (current) use of insulin: Secondary | ICD-10-CM | POA: Diagnosis not present

## 2018-02-22 DIAGNOSIS — E1169 Type 2 diabetes mellitus with other specified complication: Secondary | ICD-10-CM | POA: Diagnosis not present

## 2018-02-22 DIAGNOSIS — E1159 Type 2 diabetes mellitus with other circulatory complications: Secondary | ICD-10-CM | POA: Diagnosis not present

## 2018-02-22 DIAGNOSIS — N529 Male erectile dysfunction, unspecified: Secondary | ICD-10-CM | POA: Diagnosis not present

## 2018-02-22 LAB — HEMOGLOBIN A1C: Hemoglobin A1C: 8

## 2018-03-04 ENCOUNTER — Other Ambulatory Visit: Payer: Self-pay | Admitting: Family Medicine

## 2018-03-06 ENCOUNTER — Other Ambulatory Visit: Payer: Self-pay | Admitting: Family Medicine

## 2018-03-06 NOTE — Telephone Encounter (Signed)
Request received for ranitidine 300 mg I just approved a 3 month supply of this on 02/12/2018 Too early for refill; please resolve with pharmacy  Outpatient Medication Detail    Disp Refills Start End   ranitidine (ZANTAC) 300 MG tablet 30 tablet 2 02/12/2018    Sig - Route: Take 1 tablet (300 mg total) by mouth at bedtime. For heartburn, erflux; this replaces omeprazole - Oral   Sent to pharmacy as: ranitidine (ZANTAC) 300 MG tablet   E-Prescribing Status: Receipt confirmed by pharmacy (02/12/2018 8:05 AM EDT)

## 2018-03-07 NOTE — Telephone Encounter (Signed)
Left voicemail with pharmacy 

## 2018-03-10 ENCOUNTER — Other Ambulatory Visit: Payer: Self-pay | Admitting: Family Medicine

## 2018-04-02 DIAGNOSIS — H40153 Residual stage of open-angle glaucoma, bilateral: Secondary | ICD-10-CM | POA: Diagnosis not present

## 2018-04-02 LAB — HM DIABETES EYE EXAM

## 2018-04-24 DIAGNOSIS — L0232 Furuncle of buttock: Secondary | ICD-10-CM | POA: Diagnosis not present

## 2018-05-11 ENCOUNTER — Ambulatory Visit: Payer: BLUE CROSS/BLUE SHIELD | Admitting: Family Medicine

## 2018-05-11 ENCOUNTER — Encounter: Payer: Self-pay | Admitting: Family Medicine

## 2018-05-11 DIAGNOSIS — E1169 Type 2 diabetes mellitus with other specified complication: Secondary | ICD-10-CM | POA: Diagnosis not present

## 2018-05-11 DIAGNOSIS — Z794 Long term (current) use of insulin: Secondary | ICD-10-CM | POA: Diagnosis not present

## 2018-05-11 DIAGNOSIS — E785 Hyperlipidemia, unspecified: Secondary | ICD-10-CM

## 2018-05-11 DIAGNOSIS — Z5181 Encounter for therapeutic drug level monitoring: Secondary | ICD-10-CM

## 2018-05-11 DIAGNOSIS — Z720 Tobacco use: Secondary | ICD-10-CM | POA: Diagnosis not present

## 2018-05-11 DIAGNOSIS — N5201 Erectile dysfunction due to arterial insufficiency: Secondary | ICD-10-CM

## 2018-05-11 DIAGNOSIS — E119 Type 2 diabetes mellitus without complications: Secondary | ICD-10-CM

## 2018-05-11 MED ORDER — ONETOUCH ULTRA 2 W/DEVICE KIT: PACK | 0 refills | Status: DC

## 2018-05-11 MED ORDER — SILDENAFIL CITRATE 100 MG PO TABS: ORAL_TABLET | ORAL | 5 refills | Status: DC

## 2018-05-11 NOTE — Patient Instructions (Addendum)
Return next week or the week after for FASTING labs Try to limit saturated fats in your diet (bologna, hot dogs, barbeque, cheeseburgers, hamburgers, steak, bacon, sausage, cheese, etc.) and get more fresh fruits, vegetables, and whole grains I do encourage you to quit smoking Call [phone] to sign up for smoking cessation classes You can call 1-800-QUIT-NOW to talk with a smoking cessation coach  Steps to Quit Smoking Smoking tobacco can be bad for your health. It can also affect almost every organ in your body. Smoking puts you and people around you at risk for many serious long-lasting (chronic) diseases. Quitting smoking is hard, but it is one of the best things that you can do for your health. It is never too late to quit. What are the benefits of quitting smoking? When you quit smoking, you lower your risk for getting serious diseases and conditions. They can include:  Lung cancer or lung disease.  Heart disease.  Stroke.  Heart attack.  Not being able to have children (infertility).  Weak bones (osteoporosis) and broken bones (fractures).  If you have coughing, wheezing, and shortness of breath, those symptoms may get better when you quit. You may also get sick less often. If you are pregnant, quitting smoking can help to lower your chances of having a baby of low birth weight. What can I do to help me quit smoking? Talk with your doctor about what can help you quit smoking. Some things you can do (strategies) include:  Quitting smoking totally, instead of slowly cutting back how much you smoke over a period of time.  Going to in-person counseling. You are more likely to quit if you go to many counseling sessions.  Using resources and support systems, such as: ? Database administrator with a Social worker. ? Phone quitlines. ? Careers information officer. ? Support groups or group counseling. ? Text messaging programs. ? Mobile phone apps or applications.  Taking medicines. Some  of these medicines may have nicotine in them. If you are pregnant or breastfeeding, do not take any medicines to quit smoking unless your doctor says it is okay. Talk with your doctor about counseling or other things that can help you.  Talk with your doctor about using more than one strategy at the same time, such as taking medicines while you are also going to in-person counseling. This can help make quitting easier. What things can I do to make it easier to quit? Quitting smoking might feel very hard at first, but there is a lot that you can do to make it easier. Take these steps:  Talk to your family and friends. Ask them to support and encourage you.  Call phone quitlines, reach out to support groups, or work with a Social worker.  Ask people who smoke to not smoke around you.  Avoid places that make you want (trigger) to smoke, such as: ? Bars. ? Parties. ? Smoke-break areas at work.  Spend time with people who do not smoke.  Lower the stress in your life. Stress can make you want to smoke. Try these things to help your stress: ? Getting regular exercise. ? Deep-breathing exercises. ? Yoga. ? Meditating. ? Doing a body scan. To do this, close your eyes, focus on one area of your body at a time from head to toe, and notice which parts of your body are tense. Try to relax the muscles in those areas.  Download or buy apps on your mobile phone or tablet that can help you  stick to your quit plan. There are many free apps, such as QuitGuide from the [employer] Office manager for Disease Control and Prevention). You can find more support from smokefree.gov and other websites.  This information is not intended to replace advice given to you by your health care provider. Make sure you discuss any questions you have with your health care provider. Document Released: 09/03/2009 Document Revised: 07/05/2016 Document Reviewed: 03/24/2015 Elsevier Interactive Patient Education  2018 [place]  Risks of Smoking Smoking cigarettes is very bad for your health. Tobacco smoke has over 200 known poisons in it. It contains the poisonous gases nitrogen oxide and carbon monoxide. There are over 60 chemicals in tobacco smoke that cause cancer. Smoking is difficult to quit because a chemical in tobacco, called nicotine, causes addiction or dependence. When you smoke and inhale, nicotine is absorbed rapidly into the bloodstream through your lungs. Both inhaled and non-inhaled nicotine may be addictive. What are the risks of cigarette smoke? Cigarette smokers have an increased risk of many serious medical problems, including:  Lung cancer.  Lung disease, such as pneumonia, bronchitis, and emphysema.  Chest pain (angina) and heart attack because the heart is not getting enough oxygen.  Heart disease and peripheral blood vessel disease.  High blood pressure (hypertension).  Stroke.  Oral cancer, including cancer of the lip, mouth, or voice box.  Bladder cancer.  Pancreatic cancer.  Cervical cancer.  Pregnancy complications, including premature birth.  Stillbirths and smaller newborn babies, birth defects, and genetic damage to sperm.  Early menopause.  Lower estrogen level for women.  Infertility.  Facial wrinkles.  Blindness.  Increased risk of broken bones (fractures).  Senile dementia.  Stomach ulcers and internal bleeding.  Delayed wound healing and increased risk of complications during surgery.  Even smoking lightly shortens your life expectancy by several years.  Because of secondhand smoke exposure, children of smokers have an increased risk of the following:  Sudden infant death syndrome (SIDS).  Respiratory infections.  Lung cancer.  Heart disease.  Ear infections.  What are the benefits of quitting? There are many health benefits of quitting smoking. Here are some of them:  Within days of quitting smoking, your risk of having a heart attack  decreases, your blood flow improves, and your lung capacity improves. Blood pressure, pulse rate, and breathing patterns start returning to normal soon after quitting.  Within months, your lungs may clear up completely.  Quitting for 10 years reduces your risk of developing lung cancer and heart disease to almost that of a nonsmoker.  People who quit may see an improvement in their overall quality of life.  How do I quit smoking? Smoking is an addiction with both physical and psychological effects, and longtime habits can be hard to change. Your health care provider can recommend:  Programs and community resources, which may include group support, education, or talk therapy.  Prescription medicines to help reduce cravings.  Nicotine replacement products, such as patches, gum, and nasal sprays. Use these products only as directed. Do not replace cigarette smoking with electronic cigarettes, which are commonly called e-cigarettes. The safety of e-cigarettes is not known, and some may contain harmful chemicals.  A combination of two or more of these methods.  Where to find more information:  American Lung Association: www.lung.org  American Cancer Society: www.cancer.org Summary  Smoking cigarettes is very bad for your health. Cigarette smokers have an increased risk of many serious medical problems, including several cancers, heart disease, and  stroke.  Smoking is an addiction with both physical and psychological effects, and longtime habits can be hard to change.  By stopping right away, you can greatly reduce the risk of medical problems for you and your family.  To help you quit smoking, your health care provider can recommend programs, community resources, prescription medicines, and nicotine replacement products such as patches, gum, and nasal sprays. This information is not intended to replace advice given to you by your health care provider. Make sure you discuss any questions  you have with your health care provider. Document Released: 12/15/2004 Document Revised: 11/11/2016 Document Reviewed: 11/11/2016 Elsevier Interactive Patient Education  2017 [employer].

## 2018-05-11 NOTE — Progress Notes (Signed)
BP 112/62   Pulse 86   Temp 97.9 F (36.6 C) (Oral)   Resp 14   Ht 6' (1.829 m)   Wt 191 lb 4.8 oz (86.8 kg)   SpO2 95%   BMI 25.94 kg/m    Subjective:    Patient ID: Isaiah Northern., male    DOB: October 09, 1971, 47 y.o.   MRN: [id number]  HPI: Isaiah Tsuda. is a 47 y.o. male  Chief Complaint  Patient presents with  . Follow-up    HPI Here for f/u  Type 2 diabetes; one episode of low sugar just with skipping a meal; resolved with food Checking FSBS just once a day, mornings Highest in the last two weeks: 272 Lowest in the last two weeks: 76 Just saw eye doctor and we'll request record; eyes good No foot problems Runs in the family; sister does not have diabetes  HTN; excellent control; adds salt to food, controlled nonetheless; runs in the family Not checking away from here with my blessing  High cholesterol; nonfasting; ate lunch at 1 pm Lab Results  Component Value Date   CHOL 111 11/10/2017   HDL 34 (L) 11/10/2017   LDLCALC 52 11/10/2017   TRIG 177 (H) 11/10/2017   CHOLHDL 3.3 11/10/2017   Heartburn; okay lately; doing well on ranitidine  Taking viagra; working okay for ED  Depression screen [facility] 2/9 05/11/2018 04/28/2017 10/25/2016 04/20/2016 12/02/2015  Decreased Interest 0 0 0 0 0  Down, Depressed, Hopeless 0 0 0 0 0  PHQ - 2 Score 0 0 0 0 0    Relevant past medical, surgical, family and social history reviewed Past Medical History:  Diagnosis Date  . Allergy   . Diabetes mellitus without complication ([place])   . Dysphagia   . Glaucoma 04/20/2016  . H/O alcohol abuse   . Hypertension   . Lipoma of forehead   . Pancreatitis   . Tobacco abuse 04/20/2016   Past Surgical History:  Procedure Laterality Date  . LESION REMOVAL Left 2017   Forehead   Family History  Problem Relation Age of Onset  . Diabetes Mother   . Emphysema Father   . Diabetes Maternal Grandmother   . Hypertension Maternal Grandmother   . Diabetes Maternal Grandfather     . Hypertension Maternal Grandfather    Social History   Tobacco Use  . Smoking status: Current Every Day Smoker    Packs/day: 1.00    Years: 30.00    Pack years: 30.00    Types: Cigarettes    Start date: 11/21/1985  . Smokeless tobacco: Never Used  . Tobacco comment: patient states that he has been trying.  Substance Use Topics  . Alcohol use: No    Alcohol/week: 0.0 oz  . Drug use: No   MD note: just not ready to quit yet  Interim medical history since last visit reviewed. Allergies and medications reviewed  Review of Systems Per HPI unless specifically indicated above     Objective:    BP 112/62   Pulse 86   Temp 97.9 F (36.6 C) (Oral)   Resp 14   Ht 6' (1.829 m)   Wt 191 lb 4.8 oz (86.8 kg)   SpO2 95%   BMI 25.94 kg/m   Wt Readings from Last 3 Encounters:  05/11/18 191 lb 4.8 oz (86.8 kg)  11/10/17 195 lb 8 oz (88.7 kg)  04/28/17 195 lb 3.2 oz (88.5 kg)    Physical Exam  Constitutional: He appears well-developed and well-nourished. No distress.  HENT:  Head: Normocephalic and atraumatic.  Eyes: EOM are normal. No scleral icterus.  Neck: No thyromegaly present.  Cardiovascular: Normal rate and regular rhythm.  Pulmonary/Chest: Effort normal and breath sounds normal.  Abdominal: Soft. Bowel sounds are normal. He exhibits no distension.  Musculoskeletal: He exhibits no edema.  Neurological: Coordination normal.  Skin: Skin is warm and dry. No pallor.  Psychiatric: He has a normal mood and affect. His behavior is normal. Judgment and thought content normal.   Diabetic Foot Form - Detailed   Diabetic Foot Exam - detailed Diabetic Foot exam was performed with the following findings:  Yes 05/11/2018  3:59 PM  Visual Foot Exam completed.:  Yes  Pulse Foot Exam completed.:  Yes  Right Dorsalis Pedis:  Present Left Dorsalis Pedis:  Present  Sensory Foot Exam Completed.:  Yes Semmes-Weinstein Monofilament Test          Assessment & Plan:   Problem List  Items Addressed This Visit      Endocrine   Hyperlipidemia due to type 2 diabetes mellitus ([place]) (Chronic)    Return for fasting labs; decrease saturated fats; ideal LDL less than 70, ideal TG less than 150, ideal HDL over 40      Relevant Medications   sildenafil (VIAGRA) 100 MG tablet   Diabetes mellitus type II, controlled, with no complications ([facility]) (Chronic)    Foot exam by MD today; normal eye exam, requesting report; checking sugar daily but increased to TID if possible; trying to limit sugars drinks        Genitourinary   Erectile dysfunction    May continue PDE-5 inhibitor prn        Other   Tobacco abuse    Patient is not ready to quit; recommended cessation, but I'm aware there is little effective treatment if he does not want to quit; I am here to help if/when ready      Medication monitoring encounter    Check liver and kidneys periodically on current meds          Follow up plan: Return in about 6 months (around 11/22/2018) for follow-up visit with Dr. Sanda Klein.  An after-visit summary was printed and given to the patient at [facility].  Please see the patient instructions which may contain other information and recommendations beyond what is mentioned above in the assessment and plan.  Meds ordered this encounter  Medications  . Blood Glucose Monitoring Suppl (ONE TOUCH ULTRA 2) w/Device KIT    Sig: For checking fingerstick blood sugars    Refill:  0  . sildenafil (VIAGRA) 100 MG tablet    Sig: Take one pill 30 minutes or so prior to intimacy; best if on empty stomach or light meal    Dispense:  10 tablet    Refill:  5    No orders of the defined types were placed in this encounter.

## 2018-05-11 NOTE — Assessment & Plan Note (Signed)
Foot exam by MD today; normal eye exam, requesting report; checking sugar daily but increased to TID if possible; trying to limit sugars drinks

## 2018-05-16 ENCOUNTER — Encounter: Payer: Self-pay | Admitting: Family Medicine

## 2018-05-16 DIAGNOSIS — N529 Male erectile dysfunction, unspecified: Secondary | ICD-10-CM | POA: Insufficient documentation

## 2018-05-16 NOTE — Assessment & Plan Note (Signed)
Patient is not ready to quit; recommended cessation, but I'm aware there is little effective treatment if he does not want to quit; I am here to help if/when ready

## 2018-05-16 NOTE — Assessment & Plan Note (Signed)
May continue PDE-5 inhibitor prn

## 2018-05-16 NOTE — Assessment & Plan Note (Signed)
Return for fasting labs; decrease saturated fats; ideal LDL less than 70, ideal TG less than 150, ideal HDL over 40

## 2018-05-16 NOTE — Assessment & Plan Note (Signed)
Check liver and kidneys periodically on current meds

## 2018-05-18 ENCOUNTER — Other Ambulatory Visit: Payer: Self-pay

## 2018-05-18 DIAGNOSIS — Z5181 Encounter for therapeutic drug level monitoring: Secondary | ICD-10-CM | POA: Diagnosis not present

## 2018-05-18 DIAGNOSIS — E785 Hyperlipidemia, unspecified: Secondary | ICD-10-CM

## 2018-05-18 LAB — COMPLETE METABOLIC PANEL WITH GFR
AG Ratio: 1.3 (calc) (ref 1.0–2.5)
ALBUMIN MSPROF: 4.1 g/dL (ref 3.6–5.1)
ALKALINE PHOSPHATASE (APISO): 70 U/L (ref 40–115)
ALT: 10 U/L (ref 9–46)
AST: 14 U/L (ref 10–40)
BUN: 15 mg/dL (ref 7–25)
CO2: 27 mmol/L (ref 20–32)
Calcium: 9.5 mg/dL (ref 8.6–10.3)
Chloride: 104 mmol/L (ref 98–110)
Creat: 1.19 mg/dL (ref 0.60–1.35)
GFR, EST AFRICAN AMERICAN: 83 mL/min/{1.73_m2} (ref >=60)
GFR, Est Non African American: 72 mL/min/{1.73_m2} (ref >=60)
GLOBULIN: 3.2 g/dL (ref 1.9–3.7)
Glucose, Bld: 37 mg/dL — CL (ref 65–99)
Potassium: 4.1 mmol/L (ref 3.5–5.3)
SODIUM: 140 mmol/L (ref 135–146)
TOTAL PROTEIN: 7.3 g/dL (ref 6.1–8.1)
Total Bilirubin: 0.5 mg/dL (ref 0.2–1.2)

## 2018-05-18 LAB — LIPID PANEL
CHOLESTEROL: 204 mg/dL — AB (ref <200)
HDL: 39 mg/dL — ABNORMAL LOW (ref >40)
LDL CHOLESTEROL (CALC): 145 mg/dL — AB
NON-HDL CHOLESTEROL (CALC): 165 mg/dL — AB (ref <130)
Total CHOL/HDL Ratio: 5.2 (calc) — ABNORMAL HIGH (ref <5.0)
Triglycerides: 94 mg/dL (ref <150)

## 2018-05-21 ENCOUNTER — Other Ambulatory Visit: Payer: Self-pay | Admitting: Family Medicine

## 2018-05-21 DIAGNOSIS — E785 Hyperlipidemia, unspecified: Principal | ICD-10-CM

## 2018-05-21 DIAGNOSIS — E1169 Type 2 diabetes mellitus with other specified complication: Secondary | ICD-10-CM

## 2018-05-21 MED ORDER — ATORVASTATIN CALCIUM 80 MG PO TABS: 80.0000 mg | ORAL_TABLET | Freq: Every day | ORAL | 5 refills | Status: DC

## 2018-05-21 NOTE — Progress Notes (Signed)
Increase statin Recheck NON_fasting labs in 6-8 weeks

## 2018-05-31 ENCOUNTER — Other Ambulatory Visit: Payer: Self-pay | Admitting: Family Medicine

## 2018-07-04 DIAGNOSIS — E1165 Type 2 diabetes mellitus with hyperglycemia: Secondary | ICD-10-CM | POA: Diagnosis not present

## 2018-07-04 DIAGNOSIS — Z794 Long term (current) use of insulin: Secondary | ICD-10-CM | POA: Diagnosis not present

## 2018-07-04 DIAGNOSIS — E1169 Type 2 diabetes mellitus with other specified complication: Secondary | ICD-10-CM | POA: Diagnosis not present

## 2018-07-04 DIAGNOSIS — E1159 Type 2 diabetes mellitus with other circulatory complications: Secondary | ICD-10-CM | POA: Diagnosis not present

## 2018-08-16 DIAGNOSIS — H40153 Residual stage of open-angle glaucoma, bilateral: Secondary | ICD-10-CM | POA: Diagnosis not present

## 2018-10-25 DIAGNOSIS — E1159 Type 2 diabetes mellitus with other circulatory complications: Secondary | ICD-10-CM | POA: Diagnosis not present

## 2018-10-25 DIAGNOSIS — E1169 Type 2 diabetes mellitus with other specified complication: Secondary | ICD-10-CM | POA: Diagnosis not present

## 2018-10-25 DIAGNOSIS — Z23 Encounter for immunization: Secondary | ICD-10-CM | POA: Diagnosis not present

## 2018-10-25 DIAGNOSIS — Z794 Long term (current) use of insulin: Secondary | ICD-10-CM | POA: Diagnosis not present

## 2018-10-25 DIAGNOSIS — E1165 Type 2 diabetes mellitus with hyperglycemia: Secondary | ICD-10-CM | POA: Diagnosis not present

## 2018-10-25 LAB — HEMOGLOBIN A1C: HEMOGLOBIN A1C: 9

## 2018-11-12 ENCOUNTER — Other Ambulatory Visit: Payer: Self-pay | Admitting: Family Medicine

## 2018-11-12 NOTE — Telephone Encounter (Signed)
Patient has an appt Friday will do then

## 2018-11-12 NOTE — Telephone Encounter (Signed)
Lab Results  Component Value Date   CHOL 204 (H) 05/18/2018   HDL 39 (L) 05/18/2018   LDLCALC 145 (H) 05/18/2018   TRIG 94 05/18/2018   CHOLHDL 5.2 (H) 05/18/2018   Lab Results  Component Value Date   ALT 10 05/18/2018   ALT 24 05/30/2013   Please contact patient He was due for recheck of his lipid panel on the higher dose of statin around mid-August I just extended his orders; please have him come in soon Can you also abstract his last A1c from [facility]?  I don't see a urine microalbumin:Cr in their records, so please order one if they haven't done one Thank you!

## 2018-11-16 ENCOUNTER — Encounter: Payer: Self-pay | Admitting: Family Medicine

## 2018-11-16 ENCOUNTER — Ambulatory Visit: Payer: BLUE CROSS/BLUE SHIELD | Admitting: Family Medicine

## 2018-11-16 DIAGNOSIS — E1169 Type 2 diabetes mellitus with other specified complication: Secondary | ICD-10-CM

## 2018-11-16 DIAGNOSIS — E119 Type 2 diabetes mellitus without complications: Secondary | ICD-10-CM | POA: Diagnosis not present

## 2018-11-16 DIAGNOSIS — H409 Unspecified glaucoma: Secondary | ICD-10-CM

## 2018-11-16 DIAGNOSIS — E785 Hyperlipidemia, unspecified: Secondary | ICD-10-CM

## 2018-11-16 DIAGNOSIS — Z794 Long term (current) use of insulin: Secondary | ICD-10-CM | POA: Diagnosis not present

## 2018-11-16 DIAGNOSIS — I10 Essential (primary) hypertension: Secondary | ICD-10-CM | POA: Diagnosis not present

## 2018-11-16 DIAGNOSIS — Z720 Tobacco use: Secondary | ICD-10-CM

## 2018-11-16 NOTE — Assessment & Plan Note (Signed)
Encouraged smoking cessation; offered help when ready

## 2018-11-16 NOTE — Patient Instructions (Addendum)
Steps to Quit Smoking  Smoking tobacco can be bad for your health. It can also affect almost every organ in your body. Smoking puts you and people around you at risk for many serious long-lasting (chronic) diseases. Quitting smoking is hard, but it is one of the best things that you can do for your health. It is never too late to quit. What are the benefits of quitting smoking? When you quit smoking, you lower your risk for getting serious diseases and conditions. They can include:  Lung cancer or lung disease.  Heart disease.  Stroke.  Heart attack.  Not being able to have children (infertility).  Weak bones (osteoporosis) and broken bones (fractures). If you have coughing, wheezing, and shortness of breath, those symptoms may get better when you quit. You may also get sick less often. If you are pregnant, quitting smoking can help to lower your chances of having a baby of low birth weight. What can I do to help me quit smoking? Talk with your doctor about what can help you quit smoking. Some things you can do (strategies) include:  Quitting smoking totally, instead of slowly cutting back how much you smoke over a period of time.  Going to in-person counseling. You are more likely to quit if you go to many counseling sessions.  Using resources and support systems, such as: ? Online chats with a counselor. ? Phone quitlines. ? Printed self-help materials. ? Support groups or group counseling. ? Text messaging programs. ? Mobile phone apps or applications.  Taking medicines. Some of these medicines may have nicotine in them. If you are pregnant or breastfeeding, do not take any medicines to quit smoking unless your doctor says it is okay. Talk with your doctor about counseling or other things that can help you. Talk with your doctor about using more than one strategy at the same time, such as taking medicines while you are also going to in-person counseling. This can help make  quitting easier. What things can I do to make it easier to quit? Quitting smoking might feel very hard at first, but there is a lot that you can do to make it easier. Take these steps:  Talk to your family and friends. Ask them to support and encourage you.  Call phone quitlines, reach out to support groups, or work with a counselor.  Ask people who smoke to not smoke around you.  Avoid places that make you want (trigger) to smoke, such as: ? Bars. ? Parties. ? Smoke-break areas at work.  Spend time with people who do not smoke.  Lower the stress in your life. Stress can make you want to smoke. Try these things to help your stress: ? Getting regular exercise. ? Deep-breathing exercises. ? Yoga. ? Meditating. ? Doing a body scan. To do this, close your eyes, focus on one area of your body at a time from head to toe, and notice which parts of your body are tense. Try to relax the muscles in those areas.  Download or buy apps on your mobile phone or tablet that can help you stick to your quit plan. There are many free apps, such as QuitGuide from the CDC (Centers for Disease Control and Prevention). You can find more support from smokefree.gov and other websites. This information is not intended to replace advice given to you by your health care provider. Make sure you discuss any questions you have with your health care provider. Document Released: 09/03/2009 Document Revised: 07/05/2016   Document Reviewed: 03/24/2015 Elsevier Interactive Patient Education  2019 [employer].  Try to limit saturated fats in your diet (bologna, hot dogs, barbeque, cheeseburgers, hamburgers, steak, bacon, sausage, cheese, etc.) and get more fresh fruits, vegetables, and whole grains

## 2018-11-16 NOTE — Assessment & Plan Note (Signed)
Managed by endocrinologist; if feeling light-headed, talk to Dr. Honor Junes about decreasing the diltiazem

## 2018-11-16 NOTE — Assessment & Plan Note (Signed)
Managed by ophthalmology, eye exam UTD

## 2018-11-16 NOTE — Assessment & Plan Note (Signed)
Continue statin; goal LDL is less than 70; limit saturated fats

## 2018-11-16 NOTE — Progress Notes (Signed)
BP 110/64   Pulse (!) 58   Temp 97.6 F (36.4 C)   Ht 6' (1.829 m)   Wt 183 lb 12.8 oz (83.4 kg)   SpO2 98%   BMI 24.93 kg/m    Subjective:    Patient ID: Isaiah Stout., male    DOB: 1971/11/17, 47 y.o.   MRN: [id number]  HPI: Isaiah Stout. is a 47 y.o. male  Chief Complaint  Patient presents with  . Follow-up    HPI Here for f/u HTN; on lisinopril and diltiazem; CCB prescribed elsewhere; not taking for any other reason; Dr. Honor Junes; does not feel dizzy or light-headed; little bit of salt Type 2 diabetes; managed by endocrinologist; eye exam UTD, May 2019; has glaucoma; runs in the family; dm runs in the family too High cholesterol; statin; occasional aching in the right shoulder, but one side, pops every once in a while; room for improvement with diet Lab Results  Component Value Date   CHOL 204 (H) 05/18/2018   HDL 39 (L) 05/18/2018   LDLCALC 145 (H) 05/18/2018   TRIG 94 05/18/2018   CHOLHDL 5.2 (H) 05/18/2018      Depression screen PHQ 2/9 11/16/2018 05/11/2018 04/28/2017 10/25/2016 04/20/2016  Decreased Interest 0 0 0 0 0  Down, Depressed, Hopeless 0 0 0 0 0  PHQ - 2 Score 0 0 0 0 0  Altered sleeping 0 - - - -  Tired, decreased energy 0 - - - -  Change in appetite 0 - - - -  Feeling bad or failure about yourself  0 - - - -  Trouble concentrating 0 - - - -  Moving slowly or fidgety/restless 0 - - - -  Suicidal thoughts 0 - - - -  PHQ-9 Score 0 - - - -  Difficult doing work/chores Not difficult at all - - - -   Fall Risk  11/16/2018 05/11/2018 04/28/2017 10/25/2016 04/20/2016  Falls in the past year? 0 No No No No    Relevant past medical, surgical, family and social history reviewed Past Medical History:  Diagnosis Date  . Allergy   . Diabetes mellitus without complication ([place])   . Dysphagia   . Glaucoma 04/20/2016  . H/O alcohol abuse   . Hypertension   . Lipoma of forehead   . Pancreatitis   . Tobacco abuse 04/20/2016   Past Surgical  History:  Procedure Laterality Date  . LESION REMOVAL Left 2017   Forehead   Family History  Problem Relation Age of Onset  . Diabetes Mother   . Emphysema Father   . Diabetes Maternal Grandmother   . Hypertension Maternal Grandmother   . Diabetes Maternal Grandfather   . Hypertension Maternal Grandfather    Social History   Tobacco Use  . Smoking status: Current Every Day Smoker    Packs/day: 1.00    Years: 30.00    Pack years: 30.00    Types: Cigarettes    Start date: 11/21/1985  . Smokeless tobacco: Never Used  . Tobacco comment: patient states that he has been trying.  Substance Use Topics  . Alcohol use: No    Alcohol/week: 0.0 standard drinks  . Drug use: No     Office Visit from 11/16/2018 in [facility]  AUDIT-C Score  0      Interim medical history since last visit reviewed. Allergies and medications reviewed  Review of Systems Per HPI unless specifically indicated above  Objective:    BP 110/64   Pulse (!) 58   Temp 97.6 F (36.4 C)   Ht 6' (1.829 m)   Wt 183 lb 12.8 oz (83.4 kg)   SpO2 98%   BMI 24.93 kg/m   Wt Readings from Last 3 Encounters:  11/16/18 183 lb 12.8 oz (83.4 kg)  05/11/18 191 lb 4.8 oz (86.8 kg)  11/10/17 195 lb 8 oz (88.7 kg)    Physical Exam Constitutional:      General: He is not in acute distress.    Appearance: He is well-developed.  HENT:     Head: Normocephalic and atraumatic.  Eyes:     General: No scleral icterus. Neck:     Thyroid: No thyromegaly.  Cardiovascular:     Rate and Rhythm: Regular rhythm. Bradycardia present.  No extrasystoles are present. Pulmonary:     Effort: Pulmonary effort is normal.     Breath sounds: Normal breath sounds.  Abdominal:     General: Bowel sounds are normal. There is no distension.     Palpations: Abdomen is soft.  Skin:    General: Skin is warm and dry.     Coloration: Skin is not pale.  Neurological:     Coordination: Coordination normal.    Psychiatric:        Behavior: Behavior normal.        Thought Content: Thought content normal.        Judgment: Judgment normal.    Diabetic Foot Form - Detailed   Diabetic Foot Exam - detailed Diabetic Foot exam was performed with the following findings:  Yes 11/16/2018  8:34 AM  Visual Foot Exam completed.:  Yes  Pulse Foot Exam completed.:  Yes  Right Dorsalis Pedis:  Present Left Dorsalis Pedis:  Present  Sensory Foot Exam Completed.:  Yes Semmes-Weinstein Monofilament Test R Site 1-Great Toe:  Pos L Site 1-Great Toe:  Pos         Results for orders placed or performed in visit on 11/12/18  Hemoglobin A1c  Result Value Ref Range   Hemoglobin A1C 9.0       Assessment & Plan:   Problem List Items Addressed This Visit      Cardiovascular and Mediastinum   Essential hypertension, benign (Chronic)    Managed by endocrinologist; if feeling light-headed, talk to Dr. Honor Junes about decreasing the diltiazem        Endocrine   Hyperlipidemia due to type 2 diabetes mellitus ([place]) (Chronic)    Continue statin; goal LDL is less than 70; limit saturated fats      Relevant Orders   Lipid panel   Diabetes mellitus type II, controlled, with no complications (HCC) (Chronic)    Managed by endo; foot exam by MD      Relevant Orders   Microalbumin / creatinine urine ratio     Other   Tobacco abuse    Encouraged smoking cessation; offered help when ready      Glaucoma    Managed by ophthalmology, eye exam UTD          Follow up plan: Return in about 6 months (around 05/18/2019) for follow-up visit with Dr. Sanda Klein.  An after-visit summary was printed and given to the patient at [facility].  Please see the patient instructions which may contain other information and recommendations beyond what is mentioned above in the assessment and plan.  No orders of the defined types were placed in this encounter.   Orders Placed This  Encounter  Procedures  . Microalbumin /  creatinine urine ratio  . Lipid panel   CC: Dr. Honor Junes, see BP and pulse, consider decreasing CCB

## 2018-11-16 NOTE — Assessment & Plan Note (Signed)
Managed by endo; foot exam by MD

## 2018-11-17 LAB — LIPID PANEL
Cholesterol: 125 mg/dL (ref <200)
HDL: 42 mg/dL (ref >40)
LDL Cholesterol (Calc): 68 mg/dL (calc)
Non-HDL Cholesterol (Calc): 83 mg/dL (calc) (ref <130)
Total CHOL/HDL Ratio: 3 (calc) (ref <5.0)
Triglycerides: 73 mg/dL (ref <150)

## 2018-11-17 LAB — MICROALBUMIN / CREATININE URINE RATIO
Creatinine, Urine: 62 mg/dL (ref 20–320)
Microalb Creat Ratio: 6 mcg/mg creat (ref <30)
Microalb, Ur: 0.4 mg/dL

## 2018-11-20 ENCOUNTER — Other Ambulatory Visit: Payer: Self-pay | Admitting: Family Medicine

## 2018-11-20 MED ORDER — ATORVASTATIN CALCIUM 80 MG PO TABS: ORAL_TABLET | ORAL | 5 refills | Status: DC

## 2018-11-29 DIAGNOSIS — H40153 Residual stage of open-angle glaucoma, bilateral: Secondary | ICD-10-CM | POA: Diagnosis not present

## 2018-12-05 ENCOUNTER — Other Ambulatory Visit: Payer: Self-pay | Admitting: Family Medicine

## 2019-02-12 DIAGNOSIS — E1169 Type 2 diabetes mellitus with other specified complication: Secondary | ICD-10-CM | POA: Diagnosis not present

## 2019-02-12 DIAGNOSIS — E1165 Type 2 diabetes mellitus with hyperglycemia: Secondary | ICD-10-CM | POA: Diagnosis not present

## 2019-02-12 DIAGNOSIS — Z794 Long term (current) use of insulin: Secondary | ICD-10-CM | POA: Diagnosis not present

## 2019-02-12 DIAGNOSIS — E1159 Type 2 diabetes mellitus with other circulatory complications: Secondary | ICD-10-CM | POA: Diagnosis not present

## 2019-03-04 ENCOUNTER — Telehealth: Payer: Self-pay

## 2019-03-04 MED ORDER — FAMOTIDINE 20 MG PO TABS: 20.0000 mg | ORAL_TABLET | Freq: Two times a day (BID) | ORAL | 1 refills | Status: DC | PRN

## 2019-03-04 NOTE — Telephone Encounter (Signed)
Pt needs rx refill on zantac but recalled so needs new rx per pharmacy for famotidine

## 2019-03-04 NOTE — Telephone Encounter (Signed)
New Rx sent.

## 2019-05-14 ENCOUNTER — Other Ambulatory Visit: Payer: Self-pay | Admitting: Family Medicine

## 2019-05-20 ENCOUNTER — Encounter: Payer: Self-pay | Admitting: Nurse Practitioner

## 2019-05-20 ENCOUNTER — Other Ambulatory Visit: Payer: Self-pay

## 2019-05-20 ENCOUNTER — Ambulatory Visit (INDEPENDENT_AMBULATORY_CARE_PROVIDER_SITE_OTHER): Payer: 59 | Admitting: Nurse Practitioner

## 2019-05-20 ENCOUNTER — Ambulatory Visit: Payer: BLUE CROSS/BLUE SHIELD | Admitting: Nurse Practitioner

## 2019-05-20 VITALS — BP 120/66 | HR 73 | Temp 97.3°F | Resp 14 | Ht 72.0 in | Wt 189.7 lb

## 2019-05-20 DIAGNOSIS — Z794 Long term (current) use of insulin: Secondary | ICD-10-CM

## 2019-05-20 DIAGNOSIS — E119 Type 2 diabetes mellitus without complications: Secondary | ICD-10-CM

## 2019-05-20 DIAGNOSIS — N5201 Erectile dysfunction due to arterial insufficiency: Secondary | ICD-10-CM | POA: Diagnosis not present

## 2019-05-20 DIAGNOSIS — I10 Essential (primary) hypertension: Secondary | ICD-10-CM

## 2019-05-20 DIAGNOSIS — K219 Gastro-esophageal reflux disease without esophagitis: Secondary | ICD-10-CM

## 2019-05-20 DIAGNOSIS — E785 Hyperlipidemia, unspecified: Secondary | ICD-10-CM

## 2019-05-20 DIAGNOSIS — Z720 Tobacco use: Secondary | ICD-10-CM

## 2019-05-20 DIAGNOSIS — Z716 Tobacco abuse counseling: Secondary | ICD-10-CM

## 2019-05-20 DIAGNOSIS — E1169 Type 2 diabetes mellitus with other specified complication: Secondary | ICD-10-CM | POA: Diagnosis not present

## 2019-05-20 DIAGNOSIS — J3089 Other allergic rhinitis: Secondary | ICD-10-CM

## 2019-05-20 NOTE — Patient Instructions (Addendum)
- work on increasing vegetable intake, drink at least 64 ounces of water a day.  - work on cutting down smoking, we are here to support you if you would like to try medications to assist please let us know.  Coping with Quitting Smoking  Quitting smoking is a physical and mental challenge. You will face cravings, withdrawal symptoms, and temptation. Before quitting, work with your health care provider to make a plan that can help you cope. Preparation can help you quit and keep you from giving in. How can I cope with cravings? Cravings usually last for 5-10 minutes. If you get through it, the craving will pass. Consider taking the following actions to help you cope with cravings:  Keep your mouth busy: ? Chew sugar-free gum. ? Suck on hard candies or a straw. ? Brush your teeth.  Keep your hands and body busy: ? Immediately change to a different activity when you feel a craving. ? Squeeze or play with a ball. ? Do an activity or a hobby, like making bead jewelry, practicing needlepoint, or working with wood. ? Mix up your normal routine. ? Take a short exercise break. Go for a quick walk or run up and down stairs. ? Spend time in public places where smoking is not allowed.  Focus on doing something kind or helpful for someone else.  Call a friend or family member to talk during a craving.  Join a support group.  Call a quit line, such as 1-800-QUIT-NOW.  Talk with your health care provider about medicines that might help you cope with cravings and make quitting easier for you. How can I deal with withdrawal symptoms? Your body may experience negative effects as it tries to get used to not having nicotine in the system. These effects are called withdrawal symptoms. They may include:  Feeling hungrier than normal.  Trouble concentrating.  Irritability.  Trouble sleeping.  Feeling depressed.  Restlessness and agitation.  Craving a cigarette. To manage withdrawal  symptoms:  Avoid places, people, and activities that trigger your cravings.  Remember why you want to quit.  Get plenty of sleep.  Avoid coffee and other caffeinated drinks. These may worsen some of your symptoms. How can I handle social situations? Social situations can be difficult when you are quitting smoking, especially in the first few weeks. To manage this, you can:  Avoid parties, bars, and other social situations where people might be smoking.  Avoid alcohol.  Leave right away if you have the urge to smoke.  Explain to your family and friends that you are quitting smoking. Ask for understanding and support.  Plan activities with friends or family where smoking is not an option. What are some ways I can cope with stress? Wanting to smoke may cause stress, and stress can make you want to smoke. Find ways to manage your stress. Relaxation techniques can help. For example:  Breathe slowly and deeply, in through your nose and out through your mouth.  Listen to soothing, relaxing music.  Talk with a family member or friend about your stress.  Light a candle.  Soak in a bath or take a shower.  Think about a peaceful place. What are some ways I can prevent weight gain? Be aware that many people gain weight after they quit smoking. However, not everyone does. To keep from gaining weight, have a plan in place before you quit and stick to the plan after you quit. Your plan should include:  Having healthy  snacks. When you have a craving, it may help to: ? Eat plain popcorn, crunchy carrots, celery, or other cut vegetables. ? Chew sugar-free gum.  Changing how you eat: ? Eat small portion sizes at meals. ? Eat 4-6 small meals throughout the day instead of 1-2 large meals a day. ? Be mindful when you eat. Do not watch television or do other things that might distract you as you eat.  Exercising regularly: ? Make time to exercise each day. If you do not have time for a long  workout, do short bouts of exercise for 5-10 minutes several times a day. ? Do some form of strengthening exercise, like weight lifting, and some form of aerobic exercise, like running or swimming.  Drinking plenty of water or other low-calorie or no-calorie drinks. Drink 6-8 glasses of water daily, or as much as instructed by your health care provider. Summary  Quitting smoking is a physical and mental challenge. You will face cravings, withdrawal symptoms, and temptation to smoke again. Preparation can help you as you go through these challenges.  You can cope with cravings by keeping your mouth busy (such as by chewing gum), keeping your body and hands busy, and making calls to family, friends, or a helpline for people who want to quit smoking.  You can cope with withdrawal symptoms by avoiding places where people smoke, avoiding drinks with caffeine, and getting plenty of rest.  Ask your health care provider about the different ways to prevent weight gain, avoid stress, and handle social situations. This information is not intended to replace advice given to you by your health care provider. Make sure you discuss any questions you have with your health care provider. Document Released: 11/04/2016 Document Revised: 10/20/2017 Document Reviewed: 11/04/2016 Elsevier Patient Education  2020 [employer].

## 2019-05-20 NOTE — Progress Notes (Signed)
Name: Isaiah Stout.   MRN: [id number]    DOB: Mar 01, 1971   Date:05/20/2019       Progress Note  Subjective  Chief Complaint  Chief Complaint  Patient presents with  . Follow-up    HPI Hypertension Patient is on lisinopril 42m.  Takes medications as prescribed with no missed doses a month.  Patient has erectile dysfunction takes sildenafil PRN  Smokes 1ppd- unsure if he is ready to quit but is willing to try to cut down.  He is not compliant with low-salt diet.  Denies chest pain, headaches, blurry vision. BP Readings from Last 3 Encounters:  05/20/19 120/66  11/16/18 110/64  05/11/18 112/62    Hyperlipidemia Patient rx atorvastatin 857mnightly. Takes medications as prescribed with no missed doses a month.  Diet: fried foods 4 x a week, eats fruits maybe once a week, vegetables daily.  Denies myalgias Lab Results  Component Value Date   CHOL 125 11/16/2018   HDL 42 11/16/2018   LDLCALC 68 11/16/2018   TRIG 73 11/16/2018   CHOLHDL 3.0 11/16/2018    Diabetes Mellitus Patient is rx metformin 100057mID, Jardiance 32m85mily and toujeo 60-66units daily-will be switching to tresiba when he finishes it and additionally takes meal time insulin. He is on a ACEi and statin.  He sees endocrinology- [place]  Takes medications as prescribed with no missed doses a month.  He is due for an eye exam- has one scheduled in august  Foot exam: Has freestyle libre- average around 170's improving.  Denies polyphagia, polydipsia, polyuria.  Last A1C was 8.7 on 02/12/2019- but states it was a misread they rechecked it and it was 6.8% Lab Results  Component Value Date   HGBA1C 9.0 10/25/2018    Seasonal allergies Takes flonase   GERD Was taking pepcid but no longer needs it avoids triggers.  PHQ2/9: Depression screen PHQ [facility] 05/20/2019 11/16/2018 05/11/2018 04/28/2017 10/25/2016  Decreased Interest 0 0 0 0 0  Down, Depressed, Hopeless 0 0 0 0 0  PHQ - 2 Score 0 0 0 0 0   Altered sleeping 0 0 - - -  Tired, decreased energy 0 0 - - -  Change in appetite 0 0 - - -  Feeling bad or failure about yourself  0 0 - - -  Trouble concentrating 0 0 - - -  Moving slowly or fidgety/restless 0 0 - - -  Suicidal thoughts 0 0 - - -  PHQ-9 Score 0 0 - - -  Difficult doing work/chores Not difficult at all Not difficult at all - - -     PHQ reviewed. Negative  Patient Active Problem List   Diagnosis Date Noted  . Erectile dysfunction 05/16/2018  . History of pancreatitis 08/09/2017  . Hyperlipidemia due to type 2 diabetes mellitus (HCC)[place]/19/2018  . Allergic rhinitis 10/25/2016  . Essential hypertension, benign 04/20/2016  . Diabetes mellitus type II, controlled, with no complications (HCC)[place]/323/76/2831Glaucoma 04/20/2016  . Dyslipidemia 04/20/2016  . Tobacco abuse 04/20/2016    Past Medical History:  Diagnosis Date  . Allergy   . Diabetes mellitus without complication (HCC)[place]. Dysphagia   . Glaucoma 04/20/2016  . H/O alcohol abuse   . Hypertension   . Lipoma of forehead   . Pancreatitis   . Tobacco abuse 04/20/2016    Past Surgical History:  Procedure Laterality Date  . LESION REMOVAL Left 2017   Forehead    Social  History   Tobacco Use  . Smoking status: Current Every Day Smoker    Packs/day: 1.00    Years: 30.00    Pack years: 30.00    Types: Cigarettes    Start date: 11/21/1985  . Smokeless tobacco: Never Used  . Tobacco comment: patient states that he has been trying.  Substance Use Topics  . Alcohol use: No    Alcohol/week: 0.0 standard drinks     Current Outpatient Medications:  .  APIDRA SOLOSTAR 100 UNIT/ML Solostar Pen, INJECT 20 UNITS SUBQ WITH BREAKFAST, LUNCH AND DINNER AS DIRECTED, Disp: , Rfl: 5 .  aspirin EC 81 MG tablet, Take 1 tablet (81 mg total) by mouth daily., Disp: , Rfl:  .  atorvastatin (LIPITOR) 80 MG tablet, TAKE 1 TABLET BY MOUTH EVERY DAY, Disp: 90 tablet, Rfl: 0 .  diltiazem (TIAZAC) 180 MG 24 hr capsule,  TAKE 1 CAPSULE (180 MG TOTAL) BY MOUTH ONCE DAILY., Disp: , Rfl: 3 .  famotidine (PEPCID) 20 MG tablet, Take 1 tablet (20 mg total) by mouth 2 (two) times daily as needed for heartburn or indigestion., Disp: 180 tablet, Rfl: 1 .  fluticasone (FLONASE) 50 MCG/ACT nasal spray, USE 1 SPRAY IN EACH NOSTRIL 2 TIMES DAILY (Patient taking differently: Place 2 sprays into both nostrils as needed. USE 1 SPRAY IN EACH NOSTRIL 2 TIMES DAILY), Disp: 16 g, Rfl: 12 .  Insulin Glargine (TOUJEO MAX SOLOSTAR) 300 UNIT/ML SOPN, Inject 60-66 Units into the skin daily. , Disp: , Rfl:  .  JARDIANCE 25 MG TABS tablet, Take 25 mg by mouth daily., Disp: , Rfl: 6 .  lisinopril (PRINIVIL,ZESTRIL) 10 MG tablet, Take 10 mg by mouth daily., Disp: , Rfl: 3 .  metFORMIN (GLUCOPHAGE) 1000 MG tablet, Take 1 tablet by mouth 2 (two) times daily., Disp: , Rfl: 11 .  sildenafil (VIAGRA) 100 MG tablet, TAKE ONE TABLET 30 MINUTES OR SO PRIOR TO INTIMACY BEST IF ON EMPTY STOMACH OR LIGHT MEAL, Disp: 4 tablet, Rfl: 0 .  SIMBRINZA 1-0.2 % SUSP, PLACE 1 DROP INTO BOTH EYES TWICE A DAY, Disp: , Rfl: 4 .  Blood Glucose Monitoring Suppl (FIFTY50 GLUCOSE METER 2.0) w/Device KIT, check blood sugar 2 times daily, Disp: , Rfl:  .  Blood Glucose Monitoring Suppl (ONE TOUCH ULTRA 2) w/Device KIT, For checking fingerstick blood sugars, Disp: , Rfl: 0  No Known Allergies  Review of Systems  Constitutional: Negative for chills, fever and malaise/fatigue.  Respiratory: Negative for cough and shortness of breath.   Cardiovascular: Negative for chest pain, palpitations and leg swelling.  Gastrointestinal: Negative for abdominal pain.  Musculoskeletal: Negative for joint pain and myalgias.  Skin: Negative for rash.  Neurological: Negative for dizziness and headaches.  Psychiatric/Behavioral: The patient is not nervous/anxious and does not have insomnia.       No other specific complaints in a complete review of systems (except as listed in HPI  above).  Objective  Vitals:   05/20/19 1458  BP: 120/66  Pulse: 73  Resp: 14  Temp: (!) 97.3 F (36.3 C)  TempSrc: Tympanic  SpO2: 98%  Weight: 189 lb 11.2 oz (86 kg)  Height: 6' (1.829 m)     Body mass index is 25.73 kg/m.  Nursing Note and Vital Signs reviewed.  Physical Exam Vitals signs reviewed.  Constitutional:      Appearance: He is well-developed.  HENT:     Head: Normocephalic and atraumatic.  Neck:     Musculoskeletal: Normal range  of motion and neck supple.     Vascular: No carotid bruit.  Cardiovascular:     Heart sounds: Normal heart sounds.  Pulmonary:     Effort: Pulmonary effort is normal.     Breath sounds: Normal breath sounds.  Abdominal:     General: Bowel sounds are normal.     Palpations: Abdomen is soft.     Tenderness: There is no abdominal tenderness.  Musculoskeletal: Normal range of motion.  Skin:    General: Skin is warm and dry.     Capillary Refill: Capillary refill takes less than 2 seconds.  Neurological:     Mental Status: He is alert and oriented to person, place, and time.     GCS: GCS eye subscore is 4. GCS verbal subscore is 5. GCS motor subscore is 6.     Sensory: No sensory deficit.  Psychiatric:        Speech: Speech normal.        Behavior: Behavior normal.        Thought Content: Thought content normal.        Judgment: Judgment normal.     Diabetic Foot Form - Detailed   Diabetic Foot Exam - detailed Diabetic Foot exam was performed with the following findings: Yes 05/20/2019  3:24 PM  Visual Foot Exam completed.: Yes  Pulse Foot Exam completed.: Yes  Sensory Foot Exam Completed.: Yes Semmes-Weinstein Monofilament Test         No results found for this or any previous visit (from the past 48 hour(s)).  Assessment & Plan  1. Essential hypertension, benign Stable continue meds,  Counseled on cutting down on sodium and increasing vegetables  2. Controlled type 2 diabetes mellitus without complication,  with long-term current use of insulin (HCC) Improved control, follow-up with endocrinology   3. Hyperlipidemia due to type 2 diabetes mellitus (HCC) Stable, recheck at 6 month follow-up as last LDL was at goal   4. Erectile dysfunction due to arterial insufficiency PRN Viagra   5. GERD without esophagitis Avoid triggers   6. Non-seasonal allergic rhinitis, unspecified trigger PRN flonase   7. Encounter for tobacco use cessation counseling Discussed for greater than 3 minutes, plan to progressively cut down amount will go to 0.5ppd. nicotine gum if needed.   8. Tobacco abuse Discussed

## 2019-08-06 ENCOUNTER — Other Ambulatory Visit: Payer: Self-pay

## 2019-08-07 MED ORDER — ATORVASTATIN CALCIUM 80 MG PO TABS: 80.0000 mg | ORAL_TABLET | Freq: Every day | ORAL | 0 refills | Status: DC

## 2019-10-03 ENCOUNTER — Other Ambulatory Visit: Payer: Self-pay | Admitting: Family Medicine

## 2019-11-18 ENCOUNTER — Ambulatory Visit (INDEPENDENT_AMBULATORY_CARE_PROVIDER_SITE_OTHER): Payer: 59 | Admitting: Family Medicine

## 2019-11-18 ENCOUNTER — Other Ambulatory Visit: Payer: Self-pay

## 2019-11-18 ENCOUNTER — Encounter: Payer: Self-pay | Admitting: Family Medicine

## 2019-11-18 VITALS — BP 138/84 | HR 65 | Temp 97.8°F | Resp 18 | Ht 73.0 in | Wt 195.3 lb

## 2019-11-18 DIAGNOSIS — Z23 Encounter for immunization: Secondary | ICD-10-CM

## 2019-11-18 DIAGNOSIS — Z794 Long term (current) use of insulin: Secondary | ICD-10-CM

## 2019-11-18 DIAGNOSIS — E119 Type 2 diabetes mellitus without complications: Secondary | ICD-10-CM

## 2019-11-18 DIAGNOSIS — Z1212 Encounter for screening for malignant neoplasm of rectum: Secondary | ICD-10-CM

## 2019-11-18 DIAGNOSIS — E785 Hyperlipidemia, unspecified: Secondary | ICD-10-CM

## 2019-11-18 DIAGNOSIS — N5201 Erectile dysfunction due to arterial insufficiency: Secondary | ICD-10-CM

## 2019-11-18 DIAGNOSIS — Z1211 Encounter for screening for malignant neoplasm of colon: Secondary | ICD-10-CM

## 2019-11-18 DIAGNOSIS — Z1159 Encounter for screening for other viral diseases: Secondary | ICD-10-CM

## 2019-11-18 DIAGNOSIS — I10 Essential (primary) hypertension: Secondary | ICD-10-CM

## 2019-11-18 DIAGNOSIS — E1169 Type 2 diabetes mellitus with other specified complication: Secondary | ICD-10-CM | POA: Diagnosis not present

## 2019-11-18 DIAGNOSIS — Z125 Encounter for screening for malignant neoplasm of prostate: Secondary | ICD-10-CM

## 2019-11-18 MED ORDER — SILDENAFIL CITRATE 100 MG PO TABS: ORAL_TABLET | ORAL | 0 refills | Status: DC

## 2019-11-18 MED ORDER — ATORVASTATIN CALCIUM 80 MG PO TABS: 80.0000 mg | ORAL_TABLET | Freq: Every day | ORAL | 3 refills | Status: DC

## 2019-11-18 NOTE — Progress Notes (Signed)
Name: Isaiah Stout.   MRN: [id number]    DOB: Feb 16, 1971   Date:11/18/2019       Progress Note  Subjective  Chief Complaint  No chief complaint on file.   HPI  Diabetes mellitus type 2 - Seeing Dr. Honor Junes with endocrinology Checking sugars?  yes How often? Daily - Freestyle Libre Range (low to high) over last two weeks:  Lowest 66 - one time.  Otherwise ranging 100's-300 depending on the day. Checking feet every day/night?  yes Last eye exam: Has eye exam schedule for today. Denies: Polyuria, polydipsia, polyphagia, vision changes, or neuropathy. Most recent A1C:  Lab Results  Component Value Date   HGBA1C 9.0 10/25/2018    We will recheck today. Last CMP Results : is due for repeat today Urine Micro UTD? No Current Medication Management: Diabetic Medications:  - Tresiba 62 units. 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  Diet has been ok - tries to follow a low carb diet.   HTN: Rx Lisinopril 10mg  and Diltiazem ER daily.  Doing well.  Denies chest pain, shortness of breath, palpitations, BLE edema.  Does not follow low sodium diet.  HLD: Atorvastatin 80mg  without issues.  Denies myalgias, chest pain, or shortness of breath.  ED: Taking viagra, working well for him.   Tobacco Use: Smoking 1/2ppd.  Not ready to quit at this time. Cessation discussed in detail.  CRC Screening: New USPSTF guidelines recommend colonoscopy after age 99.  Denies family or personal history of colorectal cancer, no changes in BM's - no blood in stool, dark and tarry stool, mucus in stool, or constipation/diarrhea.  We will refer today.  Patient Active Problem List   Diagnosis Date Noted  . Erectile dysfunction 05/16/2018  . History of pancreatitis 08/09/2017  . Hyperlipidemia due to type 2 diabetes mellitus ([place])  08/09/2017  . Allergic rhinitis 10/25/2016  . Essential hypertension, benign 04/20/2016  . Diabetes mellitus type II, controlled, with no complications ([place]) [id number]  . Glaucoma 04/20/2016  . Dyslipidemia 04/20/2016  . Tobacco abuse 04/20/2016    Past Surgical History:  Procedure Laterality Date  . LESION REMOVAL Left 2017   Forehead    Family History  Problem Relation Age of Onset  . Diabetes Mother   . Emphysema Father   . Diabetes Maternal Grandmother   . Hypertension Maternal Grandmother   . Diabetes Maternal Grandfather   . Hypertension Maternal Grandfather     Social History   Socioeconomic History  . Marital status: Married    Spouse name: bobbie  . Number of children: 2  . Years of education: Not on file  . Highest education level: Not on file  Occupational History  . Not on file  Tobacco Use  . Smoking status: Current Every Day Smoker    Packs/day: 1.00    Years: 30.00    Pack years: 30.00    Types: Cigarettes    Start date: 11/21/1985  . Smokeless tobacco: Never Used  . Tobacco comment: patient states that he has been trying.  Substance and Sexual Activity  . Alcohol use: No    Alcohol/week: 0.0 standard drinks  . Drug use: No  . Sexual activity: Yes    Partners: Female    Birth control/protection: Condom  Other Topics Concern  . Not on file  Social  History Narrative  . Not on file   Social Determinants of Health   Financial Resource Strain:   . Difficulty of Paying Living Expenses: Not on file  Food Insecurity:   . Worried About Charity fundraiser in the Last Year: Not on file  . Ran Out of Food in the Last Year: Not on file  Transportation Needs:   . Lack of Transportation (Medical): Not on file  . Lack of Transportation (Non-Medical): Not on file  Physical Activity:   . Days of Exercise per Week: Not on file  . Minutes of Exercise per Session: Not on file  Stress:   . Feeling of Stress : Not on file  Social Connections:   .  Frequency of Communication with Friends and Family: Not on file  . Frequency of Social Gatherings with Friends and Family: Not on file  . Attends Religious Services: Not on file  . Active Member of Clubs or Organizations: Not on file  . Attends Archivist Meetings: Not on file  . Marital Status: Not on file  Intimate Partner Violence:   . Fear of Current or Ex-Partner: Not on file  . Emotionally Abused: Not on file  . Physically Abused: Not on file  . Sexually Abused: Not on file     Current Outpatient Medications:  .  aspirin EC 81 MG tablet, Take 1 tablet (81 mg total) by mouth daily., Disp: , Rfl:  .  atorvastatin (LIPITOR) 80 MG tablet, Take 1 tablet (80 mg total) by mouth daily., Disp: 90 tablet, Rfl: 0 .  famotidine (PEPCID) 20 MG tablet, Take 1 tablet (20 mg total) by mouth 2 (two) times daily as needed for heartburn or indigestion., Disp: 180 tablet, Rfl: 1 .  fluticasone (FLONASE) 50 MCG/ACT nasal spray, USE 1 SPRAY IN EACH NOSTRIL 2 TIMES DAILY (Patient taking differently: Place 2 sprays into both nostrils as needed. USE 1 SPRAY IN EACH NOSTRIL 2 TIMES DAILY), Disp: 16 g, Rfl: 12 .  insulin aspart (NOVOLOG) 100 UNIT/ML injection, Inject 10 Units into the skin 3 (three) times daily before meals., Disp: , Rfl:  .  Insulin Glargine (TOUJEO MAX SOLOSTAR) 300 UNIT/ML SOPN, Inject 60-66 Units into the skin daily. , Disp: , Rfl:  .  JARDIANCE 25 MG TABS tablet, Take 25 mg by mouth daily., Disp: , Rfl: 6 .  lisinopril (PRINIVIL,ZESTRIL) 10 MG tablet, Take 10 mg by mouth daily., Disp: , Rfl: 3 .  metFORMIN (GLUCOPHAGE) 1000 MG tablet, Take 1 tablet by mouth 2 (two) times daily., Disp: , Rfl: 11 .  sildenafil (VIAGRA) 100 MG tablet, TAKE ONE TABLET 30 MINUTES OR SO PRIOR TO INTIMACY BEST IF ON EMPTY STOMACH OR LIGHT MEAL, Disp: 4 tablet, Rfl: 0 .  SIMBRINZA 1-0.2 % SUSP, PLACE 1 DROP INTO BOTH EYES TWICE A DAY, Disp: , Rfl: 4  No Known Allergies  I personally reviewed active  problem list, medication list, allergies, notes from last encounter, lab results with the patient/caregiver today.   ROS  Constitutional: Negative for fever or weight change.  Respiratory: Negative for cough and shortness of breath.   Cardiovascular: Negative for chest pain or palpitations.  Gastrointestinal: Negative for abdominal pain, no bowel changes.  Musculoskeletal: Negative for gait problem or joint swelling.  Skin: Negative for rash.  Neurological: Negative for dizziness or headache.  No other specific complaints in a complete review of systems (except as listed in HPI above).  Objective  Vitals:   11/18/19 0744  BP: 138/84  Pulse: 65  Resp: 18  Temp: 97.8 F (36.6 C)  TempSrc: Temporal  SpO2: 97%  Weight: 195 lb 4.8 oz (88.6 kg)  Height: 6\' 1"  (1.854 m)    Body mass index is 25.77 kg/m.  Physical Exam  Constitutional: Patient appears well-developed and well-nourished. No distress.  HENT: Head: Normocephalic and atraumatic.  Eyes: Conjunctivae and EOM are normal. No scleral icterus.   Cardiovascular: Normal rate, regular rhythm and normal heart sounds.  No murmur heard. No BLE edema. Pulmonary/Chest: Effort normal and breath sounds normal. No respiratory distress. Musculoskeletal: Normal range of motion, no joint effusions. No gross deformities Neurological: Pt is alert and oriented to person, place, and time. No cranial nerve deficit. Coordination, balance, strength, speech and gait are normal.  Skin: Skin is warm and dry. No rash noted. No erythema.  Psychiatric: Patient has a normal mood and affect. behavior is normal. Judgment and thought content normal.  No results found for this or any previous visit (from the past 72 hour(s)).   PHQ2/9: Depression screen [facility] 2/9 11/18/2019 05/20/2019 11/16/2018 05/11/2018 04/28/2017  Decreased Interest 0 0 0 0 0  Down, Depressed, Hopeless 0 0 0 0 0  PHQ - 2 Score 0 0 0 0 0  Altered sleeping 0 0 0 - -  Tired, decreased  energy 0 0 0 - -  Change in appetite 0 0 0 - -  Feeling bad or failure about yourself  0 0 0 - -  Trouble concentrating 0 0 0 - -  Moving slowly or fidgety/restless 0 0 0 - -  Suicidal thoughts 0 0 0 - -  PHQ-9 Score 0 0 0 - -  Difficult doing work/chores Not difficult at all Not difficult at all Not difficult at all - -   PHQ-2/9 Result is negative.    Fall Risk: Fall Risk  11/18/2019 05/20/2019 11/16/2018 05/11/2018 04/28/2017  Falls in the past year? 0 0 0 No No  Number falls in past yr: 0 0 - - -  Injury with Fall? 0 0 - - -  Follow up Falls evaluation completed - - - -    Assessment & Plan  1. Essential hypertension, benign - Lisinopril & Diltiazem - COMPLETE METABOLIC PANEL WITH GFR  2. Hyperlipidemia due to type 2 diabetes mellitus (HCC) - Statin therapy - Lipid panel  3. Controlled type 2 diabetes mellitus without complication, with long-term current use of insulin ([place]) - Seeing endocrinology  - COMPLETE METABOLIC PANEL WITH GFR - Microalbumin / creatinine urine ratio  4. Erectile dysfunction due to arterial insufficiency - PSA - sildenafil (VIAGRA) 100 MG tablet; TAKE ONE TABLET 30 MINUTES OR SO PRIOR TO INTIMACY BEST IF ON EMPTY STOMACH OR LIGHT MEAL  Dispense: 4 tablet; Refill: 0  5. Prostate cancer screening - PSA  6. Needs flu shot - Flu Vaccine QUAD 6+ mos PF IM (Fluarix Quad PF)  7. Encounter for colorectal cancer screening - Ambulatory referral to Gastroenterology  8. Encounter for hepatitis C screening test for low risk patient - Hepatitis C antibody

## 2019-11-19 ENCOUNTER — Other Ambulatory Visit: Payer: Self-pay

## 2019-11-19 DIAGNOSIS — Z1211 Encounter for screening for malignant neoplasm of colon: Secondary | ICD-10-CM

## 2019-11-19 LAB — COMPLETE METABOLIC PANEL WITH GFR
AG Ratio: 1.5 (calc) (ref 1.0–2.5)
ALT: 12 U/L (ref 9–46)
AST: 13 U/L (ref 10–40)
Albumin: 4.5 g/dL (ref 3.6–5.1)
Alkaline phosphatase (APISO): 69 U/L (ref 36–130)
BUN: 16 mg/dL (ref 7–25)
CO2: 28 mmol/L (ref 20–32)
Calcium: 9.9 mg/dL (ref 8.6–10.3)
Chloride: 104 mmol/L (ref 98–110)
Creat: 1.21 mg/dL (ref 0.60–1.35)
GFR, Est African American: 81 mL/min/{1.73_m2} (ref >=60)
GFR, Est Non African American: 70 mL/min/{1.73_m2} (ref >=60)
Globulin: 3 g/dL (calc) (ref 1.9–3.7)
Glucose, Bld: 154 mg/dL — ABNORMAL HIGH (ref 65–99)
Potassium: 5.1 mmol/L (ref 3.5–5.3)
Sodium: 140 mmol/L (ref 135–146)
Total Bilirubin: 0.4 mg/dL (ref 0.2–1.2)
Total Protein: 7.5 g/dL (ref 6.1–8.1)

## 2019-11-19 LAB — LIPID PANEL
Cholesterol: 145 mg/dL (ref <200)
HDL: 39 mg/dL — ABNORMAL LOW (ref >=40)
LDL Cholesterol (Calc): 83 mg/dL (calc)
Non-HDL Cholesterol (Calc): 106 mg/dL (calc) (ref <130)
Total CHOL/HDL Ratio: 3.7 (calc) (ref <5.0)
Triglycerides: 124 mg/dL (ref <150)

## 2019-11-19 LAB — MICROALBUMIN / CREATININE URINE RATIO
Creatinine, Urine: 58 mg/dL (ref 20–320)
Microalb Creat Ratio: 22 mcg/mg creat (ref <30)
Microalb, Ur: 1.3 mg/dL

## 2019-11-19 LAB — HEPATITIS C ANTIBODY
Hepatitis C Ab: NONREACTIVE
SIGNAL TO CUT-OFF: 0.04 (ref <1.00)

## 2019-11-19 LAB — PSA: PSA: 0.4 ng/mL (ref <=4.0)

## 2019-11-19 MED ORDER — GOLYTELY 236 G PO SOLR: 4000.0000 mL | Freq: Once | ORAL | 0 refills | Status: AC

## 2019-11-26 ENCOUNTER — Other Ambulatory Visit: Payer: Self-pay

## 2019-11-26 ENCOUNTER — Other Ambulatory Visit
Admission: RE | Admit: 2019-11-26 | Discharge: 2019-11-26 | Disposition: A | Discharge status: Home / Self Care | Payer: 59 | Source: Ambulatory Visit | Attending: Gastroenterology | Admitting: Gastroenterology

## 2019-11-26 DIAGNOSIS — Z01812 Encounter for preprocedural laboratory examination: Secondary | ICD-10-CM | POA: Diagnosis present

## 2019-11-26 DIAGNOSIS — Z20822 Contact with and (suspected) exposure to covid-19: Secondary | ICD-10-CM | POA: Insufficient documentation

## 2019-11-26 LAB — SARS CORONAVIRUS 2 (TAT 6-24 HRS): SARS Coronavirus 2: NEGATIVE

## 2019-11-28 ENCOUNTER — Encounter: Payer: Self-pay | Admitting: Gastroenterology

## 2019-11-29 ENCOUNTER — Ambulatory Visit
Admission: RE | Admit: 2019-11-29 | Discharge: 2019-11-29 | Disposition: A | Discharge status: Home / Self Care | Payer: No Typology Code available for payment source | Attending: Gastroenterology | Admitting: Gastroenterology

## 2019-11-29 ENCOUNTER — Encounter
Admission: RE | Disposition: A | Discharge status: Home / Self Care | Payer: Self-pay | Source: Home / Self Care | Attending: Gastroenterology

## 2019-11-29 ENCOUNTER — Other Ambulatory Visit: Payer: Self-pay

## 2019-11-29 ENCOUNTER — Ambulatory Visit: Payer: No Typology Code available for payment source | Admitting: Anesthesiology

## 2019-11-29 ENCOUNTER — Encounter: Payer: Self-pay | Admitting: Gastroenterology

## 2019-11-29 DIAGNOSIS — Z833 Family history of diabetes mellitus: Secondary | ICD-10-CM | POA: Diagnosis not present

## 2019-11-29 DIAGNOSIS — Z79899 Other long term (current) drug therapy: Secondary | ICD-10-CM | POA: Insufficient documentation

## 2019-11-29 DIAGNOSIS — E119 Type 2 diabetes mellitus without complications: Secondary | ICD-10-CM | POA: Insufficient documentation

## 2019-11-29 DIAGNOSIS — K635 Polyp of colon: Secondary | ICD-10-CM

## 2019-11-29 DIAGNOSIS — K640 First degree hemorrhoids: Secondary | ICD-10-CM | POA: Insufficient documentation

## 2019-11-29 DIAGNOSIS — D123 Benign neoplasm of transverse colon: Secondary | ICD-10-CM | POA: Diagnosis not present

## 2019-11-29 DIAGNOSIS — Z8249 Family history of ischemic heart disease and other diseases of the circulatory system: Secondary | ICD-10-CM | POA: Diagnosis not present

## 2019-11-29 DIAGNOSIS — R1310 Dysphagia, unspecified: Secondary | ICD-10-CM | POA: Diagnosis not present

## 2019-11-29 DIAGNOSIS — Z825 Family history of asthma and other chronic lower respiratory diseases: Secondary | ICD-10-CM | POA: Insufficient documentation

## 2019-11-29 DIAGNOSIS — F17210 Nicotine dependence, cigarettes, uncomplicated: Secondary | ICD-10-CM | POA: Insufficient documentation

## 2019-11-29 DIAGNOSIS — Z1211 Encounter for screening for malignant neoplasm of colon: Secondary | ICD-10-CM | POA: Diagnosis not present

## 2019-11-29 DIAGNOSIS — Z794 Long term (current) use of insulin: Secondary | ICD-10-CM | POA: Diagnosis not present

## 2019-11-29 DIAGNOSIS — I10 Essential (primary) hypertension: Secondary | ICD-10-CM | POA: Diagnosis not present

## 2019-11-29 DIAGNOSIS — H409 Unspecified glaucoma: Secondary | ICD-10-CM | POA: Diagnosis not present

## 2019-11-29 DIAGNOSIS — Z7982 Long term (current) use of aspirin: Secondary | ICD-10-CM | POA: Insufficient documentation

## 2019-11-29 HISTORY — PX: COLONOSCOPY WITH PROPOFOL: SHX5780

## 2019-11-29 LAB — GLUCOSE, CAPILLARY: Glucose-Capillary: 155 mg/dL — ABNORMAL HIGH (ref 70–99)

## 2019-11-29 SURGERY — COLONOSCOPY WITH PROPOFOL
Anesthesia: General

## 2019-11-29 MED ORDER — PROPOFOL 10 MG/ML IV BOLUS
INTRAVENOUS | Status: DC | PRN
  Administered 2019-11-29: 100 mg via INTRAVENOUS

## 2019-11-29 MED ORDER — SODIUM CHLORIDE 0.9 % IV SOLN
INTRAVENOUS | Status: DC
  Administered 2019-11-29: 08:00:00 1000 mL via INTRAVENOUS

## 2019-11-29 MED ORDER — PROPOFOL 500 MG/50ML IV EMUL
INTRAVENOUS | Status: DC | PRN
  Administered 2019-11-29: 175 ug/kg/min via INTRAVENOUS

## 2019-11-29 NOTE — Anesthesia Preprocedure Evaluation (Signed)
Anesthesia Evaluation  Patient identified by MRN, date of birth, ID band Patient awake    Reviewed: Allergy & Precautions, NPO status , Patient's Chart, lab work & pertinent test results  History of Anesthesia Complications Negative for: history of anesthetic complications  Airway Mallampati: II  TM Distance: >3 FB Neck ROM: Full    Dental  (+) Poor Dentition,    Pulmonary neg sleep apnea, neg COPD, Current SmokerPatient did not abstain from smoking.,    breath sounds clear to auscultation- rhonchi (-) wheezing      Cardiovascular hypertension, Pt. on medications (-) CAD, (-) Past MI, (-) Cardiac Stents and (-) CABG  Rhythm:Regular Rate:Normal - Systolic murmurs and - Diastolic murmurs    Neuro/Psych neg Seizures negative neurological ROS  negative psych ROS   GI/Hepatic negative GI ROS, Neg liver ROS,   Endo/Other  diabetes, Insulin Dependent  Renal/GU negative Renal ROS     Musculoskeletal negative musculoskeletal ROS (+)   Abdominal (+) - obese,   Peds  Hematology negative hematology ROS (+)   Anesthesia Other Findings Past Medical History: No date: Allergy No date: Diabetes mellitus without complication (HCC) No date: Dysphagia 04/20/2016: Glaucoma No date: H/O alcohol abuse No date: Hypertension No date: Lipoma of forehead No date: Pancreatitis 04/20/2016: Tobacco abuse   Reproductive/Obstetrics                             Anesthesia Physical Anesthesia Plan  ASA: II  Anesthesia Plan: General   Post-op Pain Management:    Induction: Intravenous  PONV Risk Score and Plan: 0 and Propofol infusion  Airway Management Planned: Natural Airway  Additional Equipment:   Intra-op Plan:   Post-operative Plan:   Informed Consent: I have reviewed the patients History and Physical, chart, labs and discussed the procedure including the risks, benefits and alternatives for the  proposed anesthesia with the patient or authorized representative who has indicated his/her understanding and acceptance.     Dental advisory given  Plan Discussed with: CRNA and Anesthesiologist  Anesthesia Plan Comments:         Anesthesia Quick Evaluation

## 2019-11-29 NOTE — Op Note (Signed)
[facility] Gastroenterology Patient Name: Isaiah Stout Procedure Date: 11/29/2019 8:34 AM MRN: [id number] Account #: [id number] Date of Birth: 06-09-1971 Admit Type: Outpatient Age: 49 Room: [facility] ENDO ROOM 3 Gender: Male Note Status: Finalized Procedure:             Colonoscopy Indications:           Screening for colorectal malignant neoplasm Providers:             Jonathon Bellows MD, MD Medicines:             Monitored Anesthesia Care Complications:         No immediate complications. Procedure:             Pre-Anesthesia Assessment:                        - Prior to the procedure, a History and Physical was                         performed, and patient medications, allergies and                         sensitivities were reviewed. The patient's tolerance                         of previous anesthesia was reviewed.                        - The risks and benefits of the procedure and the                         sedation options and risks were discussed with the                         patient. All questions were answered and informed                         consent was obtained.                        - ASA Grade Assessment: II - A patient with mild                         systemic disease.                        After obtaining informed consent, the colonoscope was                         passed under direct vision. Throughout the procedure,                         the patient's blood pressure, pulse, and oxygen                         saturations were monitored continuously. The                         Colonoscope was introduced through the anus and  advanced to the the cecum, identified by the                         appendiceal orifice. The colonoscopy was performed                         with ease. The patient tolerated the procedure well.                         The quality of the bowel preparation was good. Findings:      The  perianal and digital rectal examinations were normal.      Two sessile polyps were found in the transverse colon. The polyps were 5       to 6 mm in size. These polyps were removed with a cold snare. Resection       and retrieval were complete.      Non-bleeding internal hemorrhoids were found during retroflexion. The       hemorrhoids were medium-sized and Grade I (internal hemorrhoids that do       not prolapse).      The exam was otherwise without abnormality on direct and retroflexion       views. Impression:            - Two 5 to 6 mm polyps in the transverse colon,                         removed with a cold snare. Resected and retrieved.                        - Non-bleeding internal hemorrhoids.                        - The examination was otherwise normal on direct and                         retroflexion views. Recommendation:        - Discharge patient to home (with escort).                        - Resume previous diet.                        - Continue present medications.                        - Await pathology results.                        - Repeat colonoscopy for surveillance based on                         pathology results. Procedure Code(s):     --- Professional ---                        [phone], Colonoscopy, flexible; with removal of                         tumor(s), polyp(s), or other lesion(s) by snare  technique Diagnosis Code(s):     --- Professional ---                        Z12.11, Encounter for screening for malignant neoplasm                         of colon                        K63.5, Polyp of colon                        K64.0, First degree hemorrhoids CPT copyright 2019 American Medical Association. All rights reserved. The codes documented in this report are preliminary and upon coder review may  be revised to meet current compliance requirements. Jonathon Bellows, MD Jonathon Bellows MD, MD 11/29/2019 9:03:48 AM This report has been  signed electronically. Number of Addenda: 0 Note Initiated On: 11/29/2019 8:34 AM Scope Withdrawal Time: 0 hours 13 minutes 6 seconds  Total Procedure Duration: 0 hours 17 minutes 29 seconds  Estimated Blood Loss:  Estimated blood loss: none.      [facility]

## 2019-11-29 NOTE — Anesthesia Procedure Notes (Signed)
Date/Time: 11/29/2019 8:47 AM Performed by: Nelda Marseille, CRNA Pre-anesthesia Checklist: Patient identified, Emergency Drugs available, Suction available, Patient being monitored and Timeout performed Oxygen Delivery Method: Nasal cannula

## 2019-11-29 NOTE — Anesthesia Postprocedure Evaluation (Signed)
Anesthesia Post Note  Patient: Isaiah Stout Isaiah Stout.  Procedure(s) Performed: COLONOSCOPY WITH PROPOFOL (N/A )  Patient location during evaluation: Endoscopy Anesthesia Type: General Level of consciousness: awake and alert and oriented Pain management: pain level controlled Vital Signs Assessment: post-procedure vital signs reviewed and stable Respiratory status: spontaneous breathing, nonlabored ventilation and respiratory function stable Cardiovascular status: blood pressure returned to baseline and stable Postop Assessment: no signs of nausea or vomiting Anesthetic complications: no     Last Vitals:  Vitals:   11/29/19 0906 11/29/19 0916  BP: 108/70 117/84  Pulse:    Resp: 12   Temp: (!) 36.2 C   SpO2: 96%     Last Pain:  Vitals:   11/29/19 0936  TempSrc:   PainSc: 0-No pain                 Amberia Bayless

## 2019-11-29 NOTE — Transfer of Care (Signed)
Immediate Anesthesia Transfer of Care Note  Patient: Isaiah Stout.  Procedure(s) Performed: COLONOSCOPY WITH PROPOFOL (N/A )  Patient Location: PACU  Anesthesia Type:General  Level of Consciousness: sedated  Airway & Oxygen Therapy: Patient Spontanous Breathing and Patient connected to nasal cannula oxygen  Post-op Assessment: Report given to RN and Post -op Vital signs reviewed and stable  Post vital signs: Reviewed and stable  Last Vitals:  Vitals Value Taken Time  BP 108/70 11/29/19 0907  Temp 36.2 C 11/29/19 0906  Pulse 61 11/29/19 0909  Resp 12 11/29/19 0909  SpO2 96 % 11/29/19 0909  Vitals shown include unvalidated device data.  Last Pain:  Vitals:   11/29/19 0906  TempSrc: Temporal  PainSc: Asleep         Complications: No apparent anesthesia complications

## 2019-11-29 NOTE — H&P (Signed)
Isaiah Bellows, MD [address], [place], [place], [place], [address] [address], [place], [place], [place], [address] Phone: [phone]  Fax: [phone]  Primary Care Physician:  Hubbard Hartshorn, FNP   Pre-Procedure History & Physical: HPI:  Isaiah Feaser. is a 49 y.o. male is here for an colonoscopy.   Past Medical History:  Diagnosis Date  . Allergy   . Diabetes mellitus without complication ([place])   . Dysphagia   . Glaucoma 04/20/2016  . H/O alcohol abuse   . Hypertension   . Lipoma of forehead   . Pancreatitis   . Tobacco abuse 04/20/2016    Past Surgical History:  Procedure Laterality Date  . LESION REMOVAL Left 2017   Forehead    Prior to Admission medications   Medication Sig Start Date End Date Taking? Authorizing Provider  insulin aspart (NOVOLOG) 100 UNIT/ML injection Inject 10 Units into the skin 3 (three) times daily before meals.   Yes [provider]  aspirin EC 81 MG tablet Take 1 tablet (81 mg total) by mouth daily. 04/20/16   Arnetha Courser, MD  atorvastatin (LIPITOR) 80 MG tablet Take 1 tablet (80 mg total) by mouth daily. 11/18/19   Hubbard Hartshorn, FNP  diltiazem (TIAZAC) 180 MG 24 hr capsule Take 180 mg by mouth daily. Rx'd by Dr. Honor Junes    [provider]  fluticasone (FLONASE) 50 MCG/ACT nasal spray USE 1 SPRAY IN EACH NOSTRIL 2 TIMES DAILY Patient taking differently: Place 2 sprays into both nostrils as needed. USE 1 SPRAY IN EACH NOSTRIL 2 TIMES DAILY 10/25/16   Lada, Satira Anis, MD  Insulin Glargine (TOUJEO MAX SOLOSTAR) 300 UNIT/ML SOPN Inject 60-66 Units into the skin daily.  08/09/17   [provider]  JARDIANCE 25 MG TABS tablet Take 25 mg by mouth daily. 04/08/17   [provider]  lisinopril (PRINIVIL,ZESTRIL) 10 MG tablet Take 10 mg by mouth daily. 09/12/17   [provider]  metFORMIN (GLUCOPHAGE) 1000 MG tablet Take 1 tablet by mouth 2 (two) times daily. 03/24/16   [provider]  sildenafil (VIAGRA) 100 MG tablet TAKE ONE TABLET 30 MINUTES OR SO PRIOR TO INTIMACY BEST IF ON EMPTY STOMACH OR LIGHT MEAL 11/18/19   Hubbard Hartshorn, FNP  SIMBRINZA 1-0.2 % SUSP PLACE 1 DROP INTO BOTH EYES TWICE A DAY 11/04/17   [provider]    Allergies as of 11/18/2019  . (No Known Allergies)    Family History  Problem Relation Age of Onset  . Diabetes Mother   . Emphysema Father   . Diabetes Maternal Grandmother   . Hypertension Maternal Grandmother   . Diabetes Maternal Grandfather   . Hypertension Maternal Grandfather     Social History   Socioeconomic History  . Marital status: Married    Spouse name: Isaiah Stout  . Number of children: 2  . Years of education: Not on file  . Highest education level: Not on file  Occupational History  . Not on file  Tobacco Use  . Smoking status: Current Every Day Smoker    Packs/day: 1.00    Years: 30.00    Pack years: 30.00    Types: Cigarettes    Start date: 11/21/1985  . Smokeless tobacco: Never Used  . Tobacco comment: patient states that he has been trying.  Substance and Sexual Activity  . Alcohol use: No    Alcohol/week: 0.0 standard drinks  . Drug use:  No  . Sexual activity: Yes    Partners: Female    Birth control/protection: Condom  Other Topics Concern  . Not on file  Social History Narrative  . Not on file   Social Determinants of Health   Financial Resource Strain:   . Difficulty of Paying Living Expenses: Not on file  Food Insecurity:   . Worried About Charity fundraiser in the Last Year: Not on file  . Ran Out of Food in the Last Year: Not on file  Transportation Needs:   . Lack of Transportation (Medical): Not on file  . Lack of Transportation (Non-Medical): Not on file  Physical Activity:   . Days of Exercise per Week: Not on file  . Minutes of Exercise per Session: Not on file  Stress:   . Feeling of Stress : Not on file  Social Connections:   . Frequency of Communication  with Friends and Family: Not on file  . Frequency of Social Gatherings with Friends and Family: Not on file  . Attends Religious Services: Not on file  . Active Member of Clubs or Organizations: Not on file  . Attends Archivist Meetings: Not on file  . Marital Status: Not on file  Intimate Partner Violence:   . Fear of Current or Ex-Partner: Not on file  . Emotionally Abused: Not on file  . Physically Abused: Not on file  . Sexually Abused: Not on file    Review of Systems: See HPI, otherwise negative ROS  Physical Exam: BP (!) 152/101   Pulse 70   Temp 97.8 F (36.6 C) (Skin)   Resp 16   Ht 6' (1.829 m)   Wt 88.5 kg   SpO2 98%   BMI 26.45 kg/m  General:   Alert,  pleasant and cooperative in NAD Head:  Normocephalic and atraumatic. Neck:  Supple; no masses or thyromegaly. Lungs:  Clear throughout to auscultation, normal respiratory effort.    Heart:  +S1, +S2, Regular rate and rhythm, No edema. Abdomen:  Soft, nontender and nondistended. Normal bowel sounds, without guarding, and without rebound.   Neurologic:  Alert and  oriented x4;  grossly normal neurologically.  Impression/Plan: Isaiah [place]. is here for an colonoscopy to be performed for Screening colonoscopy average risk   Risks, benefits, limitations, and alternatives regarding  colonoscopy have been reviewed with the patient.  Questions have been answered.  All parties agreeable.   Isaiah Bellows, MD  11/29/2019, 8:32 AM

## 2019-12-02 ENCOUNTER — Encounter: Payer: Self-pay | Admitting: Gastroenterology

## 2019-12-02 LAB — SURGICAL PATHOLOGY

## 2020-01-03 ENCOUNTER — Other Ambulatory Visit: Payer: Self-pay | Admitting: Family Medicine

## 2020-01-03 DIAGNOSIS — N5201 Erectile dysfunction due to arterial insufficiency: Secondary | ICD-10-CM

## 2020-04-02 LAB — HEMOGLOBIN A1C: Hemoglobin A1C: 8.1

## 2020-05-19 ENCOUNTER — Encounter: Payer: 59 | Admitting: Family Medicine

## 2020-06-08 NOTE — Progress Notes (Signed)
Patient ID: Isaiah Stout., male    DOB: 1971/04/25, 48 y.o.   MRN: [id number]  PCP: Hubbard Hartshorn, FNP  Chief Complaint  Patient presents with  . Follow-up    Patient says he is here to follow up     Subjective:   Isaiah Stout. is a 49 y.o. male, presents to clinic with CC of the following:  Chief Complaint  Patient presents with  . Follow-up    Patient says he is here to follow up     HPI:  Patient is a 49 year old male patient of [name]. Last visit with her was in December 2020 Follows up today  Diabetes mellitus type 2 - Seeing Dr. Honor Junes with endocrinology Last visit with endocrine was 04/02/2020 Recommendations as follows: a1c today was 8.1 but as you recall, it is artificially elevated. Your sugars and your prior secondary glucose testing tells me your a1c is probably around 7 or even 6.9. Let's keep everything the same. When you go to your eye doctors office today, ask them to fax me a copy of your last note (at fax [phone]).  He is currently on metformin 1000 mg twice a day, Tresiba 62 units each night, Jardiance 25 mg daily, and NovoLog with meals. He takes 14 with breakfast, 12 with lunch and 14 with supper. He does not usually add extra to his dose.  He continues on the freestyle libre. It was downloaded and reviewed last visit wit endo - The average was 157 (175, 158 and 164 the last 3 times). Overall, his download looks good.   Last eye exam: 04/02/20 - has drops for glaucoma, denies any diabetic concerns Denies: Polyuria, polydipsia, polyphagia, vision changes, or neuropathy. Lab Results  Component Value Date   HGBA1C 8.1 04/02/2020   HGBA1C 9.0 10/25/2018   HGBA1C 8.0 02/22/2018   Lab Results  Component Value Date   MICROALBUR 1.3 11/18/2019   LDLCALC 83 11/18/2019   CREATININE 1.21 11/18/2019     ACEI/ARB: Yes Statin: Yes   Overweight:  Wt Readings from Last 3 Encounters:  06/09/20 195 lb 1.6 oz (88.5 kg)  11/29/19  195 lb (88.5 kg)  11/18/19 195 lb 4.8 oz (88.6 kg)   Diet - tries to watch "most of the time" Exercise - mowing lawns, no regular exercise otherwise  HTN:  Medication regimen-lisinopril 10mg  and Diltiazem ER daily.   Taking medications regularly  He does not check blood pressures at home  BP Readings from Last 3 Encounters:  06/09/20 118/66  11/29/19 117/84  11/18/19 138/84    Denies chest pain, palpitations, shortness of breath, increased BLE edema.  Increased headaches or vision changes  HLD:  Irritation regimen-atorvastatin 80mg   Takes medication regularly Lab Results  Component Value Date   CHOL 145 11/18/2019   HDL 39 (L) 11/18/2019   LDLCALC 83 11/18/2019   TRIG 124 11/18/2019   CHOLHDL 3.7 11/18/2019     Denies myalgias,   ED: Taking viagra, working well for him.   Tobacco Use: Smoking 1 ppd.  Not ready to quit at this time. Cessation strongly encouraged over time  CRC Screening: Denies family or personal history of colorectal cancer,  He had a screening colonoscopy in January 2021, 2 polyps were found, tubular adenomas, and nonbleeding internal hemorrhoids. F/u 5 years.  no recent blood in stool, dark or tarry stool, chronic abdominal pain.  Prostate CA screening One cousin with prostate CA Up once a night to  urinate only about twice a week, denies hesitancy, urgency, hematuria,  Lab Results  Component Value Date   PSA1 0.6 09/02/2015   PSA 0.4 11/18/2019        Patient Active Problem List   Diagnosis Date Noted  . Erectile dysfunction 05/16/2018  . History of pancreatitis 08/09/2017  . Hyperlipidemia due to type 2 diabetes mellitus ([place]) 08/09/2017  . Allergic rhinitis 10/25/2016  . Essential hypertension, benign 04/20/2016  . Diabetes mellitus type II, controlled, with no complications ([place]) 63/11/6008  . Glaucoma 04/20/2016  . Tobacco abuse 04/20/2016      Current Outpatient Medications:  .  aspirin EC 81 MG tablet, Take 1 tablet  (81 mg total) by mouth daily., Disp: , Rfl:  .  atorvastatin (LIPITOR) 80 MG tablet, Take 1 tablet (80 mg total) by mouth daily., Disp: 90 tablet, Rfl: 3 .  diltiazem (TIAZAC) 180 MG 24 hr capsule, Take 180 mg by mouth daily. Rx'd by Dr. Honor Junes, Disp: , Rfl:  .  fluticasone (FLONASE) 50 MCG/ACT nasal spray, USE 1 SPRAY IN EACH NOSTRIL 2 TIMES DAILY (Patient taking differently: Place 2 sprays into both nostrils as needed. USE 1 SPRAY IN EACH NOSTRIL 2 TIMES DAILY), Disp: 16 g, Rfl: 12 .  insulin aspart (NOVOLOG) 100 UNIT/ML injection, Inject 10 Units into the skin 3 (three) times daily before meals., Disp: , Rfl:  .  Insulin Glargine (TOUJEO MAX SOLOSTAR) 300 UNIT/ML SOPN, Inject 60-66 Units into the skin daily. , Disp: , Rfl:  .  JARDIANCE 25 MG TABS tablet, Take 25 mg by mouth daily., Disp: , Rfl: 6 .  lisinopril (PRINIVIL,ZESTRIL) 10 MG tablet, Take 10 mg by mouth daily., Disp: , Rfl: 3 .  metFORMIN (GLUCOPHAGE) 1000 MG tablet, Take 1 tablet by mouth 2 (two) times daily., Disp: , Rfl: 11 .  sildenafil (VIAGRA) 100 MG tablet, TAKE ONE TABLET 30 MINUTES OR SO PRIOR TO INTIMACY BEST IF ON EMPTY STOMACH OR LIGHT MEAL, Disp: 4 tablet, Rfl: 0 .  SIMBRINZA 1-0.2 % SUSP, PLACE 1 DROP INTO BOTH EYES TWICE A DAY, Disp: , Rfl: 4   No Known Allergies   Past Surgical History:  Procedure Laterality Date  . COLONOSCOPY WITH PROPOFOL N/A 11/29/2019   Procedure: COLONOSCOPY WITH PROPOFOL;  Surgeon: Jonathon Bellows, MD;  [facility] END[facility]COPY;  Service: Gastroenterology;  Laterality: N/A;  . LESION REMOVAL Left 2017   Forehead     Family History  Problem Relation Age of Onset  . Diabetes Mother   . Emphysema Father   . Diabetes Maternal Grandmother   . Hypertension Maternal Grandmother   . Diabetes Maternal Grandfather   . Hypertension Maternal Grandfather      Social History   Tobacco Use  . Smoking status: Current Every Day Smoker    Packs/day: 1.00    Years: 30.00    Pack years: 30.00     Types: Cigarettes    Start date: 11/21/1985  . Smokeless tobacco: Never Used  . Tobacco comment: patient states that he has been trying.  Substance Use Topics  . Alcohol use: No    Alcohol/week: 0.0 standard drinks    With staff assistance, above reviewed with the patient today.  ROS: As per HPI, otherwise no specific complaints on a limited and focused system review   No results found for this or any previous visit (from the past 72 hour(s)).   PHQ2/9: Depression screen [facility] 2/9 06/09/2020 11/18/2019 05/20/2019 11/16/2018 05/11/2018  Decreased Interest 0 0 0  0 0  Down, Depressed, Hopeless 0 0 0 0 0  PHQ - 2 Score 0 0 0 0 0  Altered sleeping 0 0 0 0 -  Tired, decreased energy 0 0 0 0 -  Change in appetite 0 0 0 0 -  Feeling bad or failure about yourself  0 0 0 0 -  Trouble concentrating 0 0 0 0 -  Moving slowly or fidgety/restless 0 0 0 0 -  Suicidal thoughts 0 0 0 0 -  PHQ-9 Score 0 0 0 0 -  Difficult doing work/chores Not difficult at all Not difficult at all Not difficult at all Not difficult at all -   PHQ-2/9 Result is neg  Fall Risk: Fall Risk  06/09/2020 11/18/2019 05/20/2019 11/16/2018 05/11/2018  Falls in the past year? 0 0 0 0 No  Number falls in past yr: 0 0 0 - -  Injury with Fall? 0 0 0 - -  Follow up - Falls evaluation completed - - -      Objective:   Vitals:   06/09/20 0821  BP: 118/66  Pulse: 74  Resp: 16  Temp: 97.9 F (36.6 C)  TempSrc: Temporal  SpO2: 99%  Weight: 195 lb 1.6 oz (88.5 kg)  Height: 6\' 1"  (1.854 m)    Body mass index is 25.74 kg/m.  Physical Exam   NAD, masked, very pleasant HEENT - [place]/AT, sclera anicteric, PERRL, EOMI, conj - non-inj'ed, pharynx clear Neck - supple, no adenopathy, no TM, carotids 2+ and = without bruits bilat Car - RRR without m/g/r Pulm- RR and effort normal at rest, CTA without wheeze or rales Abd - soft, NT, ND, BS+,  no masses.  No HSM Back - no CVA tenderness Skin- no rash noted on exposed areas, a  small cystic entity was present above his right eyebrow very lateral which was nontender, had no overlying skin concerns with no erythema, or skin disruption.  (He notes he has been followed for this, and had one removed on the opposite side, but this was not felt needed to be removed in following).  He also had a small raised fleshy colored nodularity on his left cheek, has had for years with no recent concerning changes. Ext - no LE edema, no active joints Neuro/psychiatric - affect was not flat, appropriate with conversation  Alert and oriented  Grossly non-focal - good strength on testing extremities, sensation intact to LT in distal extremities, Romberg negative, no pronator drift  Speech and gait are normal   Results for orders placed or performed in visit on 06/09/20  Hemoglobin A1c  Result Value Ref Range   Hemoglobin A1C 8.1        Assessment & Plan:   1. Controlled type 2 diabetes mellitus without complication, with long-term current use of insulin ([place]) Followed by endocrine, with last visit in May. Noted sugars have been fairly well controlled on review of his libre. Continue his current regimen and continued follow-up with endocrine as planned. - BASIC METABOLIC PANEL WITH GFR  2. Essential hypertension, benign Blood pressure remains controlled with his current medication regimen. We will check a BMP today. Continue his current medication regimen. - BASIC METABOLIC PANEL WITH GFR  3. Hyperlipidemia due to type 2 diabetes mellitus ([place]) Last lipid panel in December reviewed, with LDL slightly above goal (83), with goal less than 70. Encouraged dietary modifications and some increase in regular aerobic exercise would be helpful He is on a high-dose statin presently, and  tolerating. We will recheck lipid panel again on our next follow-up, and he was not fasting today.  4. Erectile dysfunction due to arterial insufficiency Can continue the Viagra product as he notes is  helpful when takes.  Noted does not use all that frequently  5. Tobacco abuse Encouraged tobacco cessation, although he is not ready to try to quit at this point.  6. Glaucoma, unspecified glaucoma type, unspecified laterality Continue follow-up with his eye physician. He does have eyedrops to take to help with this presently.  7. Screening for colorectal cancer Reviewed this with him today, and the recent polyps noted on the recent colonoscopy and follow-up planned in about 5 years.  8. Screening for prostate cancer PSAs have been good on recent checks, and will continue. No recent symptoms of concern noted.  9. Skin lesions of face Continuing to follow a cystic lesion noted Reviewed the ABCDEs with him today to follow a raised fleshy colored area on his left cheek which is not concerning presently in appearance.  We will check a BMP today, and will follow up again in about 6 months time, sooner as needed.  He will continue to follow with endocrine as well for his diabetes.  We will recheck lipid panel, PSA with next lab draws      Towanda Malkin, MD 06/09/20 8:40 AM

## 2020-06-09 ENCOUNTER — Encounter: Payer: Self-pay | Admitting: Internal Medicine

## 2020-06-09 ENCOUNTER — Ambulatory Visit (INDEPENDENT_AMBULATORY_CARE_PROVIDER_SITE_OTHER): Payer: BC Managed Care – PPO | Admitting: Internal Medicine

## 2020-06-09 ENCOUNTER — Other Ambulatory Visit: Payer: Self-pay

## 2020-06-09 VITALS — BP 118/66 | HR 74 | Temp 97.9°F | Resp 16 | Ht 73.0 in | Wt 195.1 lb

## 2020-06-09 DIAGNOSIS — Z125 Encounter for screening for malignant neoplasm of prostate: Secondary | ICD-10-CM

## 2020-06-09 DIAGNOSIS — Z794 Long term (current) use of insulin: Secondary | ICD-10-CM

## 2020-06-09 DIAGNOSIS — N5201 Erectile dysfunction due to arterial insufficiency: Secondary | ICD-10-CM | POA: Diagnosis not present

## 2020-06-09 DIAGNOSIS — Z1212 Encounter for screening for malignant neoplasm of rectum: Secondary | ICD-10-CM

## 2020-06-09 DIAGNOSIS — E119 Type 2 diabetes mellitus without complications: Secondary | ICD-10-CM | POA: Diagnosis not present

## 2020-06-09 DIAGNOSIS — I10 Essential (primary) hypertension: Secondary | ICD-10-CM | POA: Diagnosis not present

## 2020-06-09 DIAGNOSIS — E1169 Type 2 diabetes mellitus with other specified complication: Secondary | ICD-10-CM | POA: Diagnosis not present

## 2020-06-09 DIAGNOSIS — Z1211 Encounter for screening for malignant neoplasm of colon: Secondary | ICD-10-CM

## 2020-06-09 DIAGNOSIS — E785 Hyperlipidemia, unspecified: Secondary | ICD-10-CM

## 2020-06-09 DIAGNOSIS — L989 Disorder of the skin and subcutaneous tissue, unspecified: Secondary | ICD-10-CM

## 2020-06-09 DIAGNOSIS — H409 Unspecified glaucoma: Secondary | ICD-10-CM

## 2020-06-09 DIAGNOSIS — Z720 Tobacco use: Secondary | ICD-10-CM

## 2020-06-10 LAB — BASIC METABOLIC PANEL WITH GFR
BUN: 16 mg/dL (ref 7–25)
CO2: 26 mmol/L (ref 20–32)
Calcium: 9.4 mg/dL (ref 8.6–10.3)
Chloride: 107 mmol/L (ref 98–110)
Creat: 1.09 mg/dL (ref 0.70–1.33)
GFR, Est African American: 91 mL/min/{1.73_m2} (ref >=60)
GFR, Est Non African American: 79 mL/min/{1.73_m2} (ref >=60)
Glucose, Bld: 143 mg/dL — ABNORMAL HIGH (ref 65–99)
Potassium: 4.6 mmol/L (ref 3.5–5.3)
Sodium: 140 mmol/L (ref 135–146)

## 2020-06-23 ENCOUNTER — Other Ambulatory Visit: Payer: Self-pay

## 2020-06-23 DIAGNOSIS — N5201 Erectile dysfunction due to arterial insufficiency: Secondary | ICD-10-CM

## 2020-06-23 MED ORDER — SILDENAFIL CITRATE 100 MG PO TABS: ORAL_TABLET | ORAL | 5 refills | Status: AC

## 2020-09-16 ENCOUNTER — Other Ambulatory Visit: Payer: Self-pay

## 2020-09-16 DIAGNOSIS — E785 Hyperlipidemia, unspecified: Secondary | ICD-10-CM

## 2020-09-16 DIAGNOSIS — E1169 Type 2 diabetes mellitus with other specified complication: Secondary | ICD-10-CM

## 2020-09-16 MED ORDER — ATORVASTATIN CALCIUM 80 MG PO TABS: 80.0000 mg | ORAL_TABLET | Freq: Every day | ORAL | 3 refills | Status: AC

## 2020-10-06 DIAGNOSIS — E1169 Type 2 diabetes mellitus with other specified complication: Secondary | ICD-10-CM | POA: Diagnosis not present

## 2020-10-06 DIAGNOSIS — E1165 Type 2 diabetes mellitus with hyperglycemia: Secondary | ICD-10-CM | POA: Diagnosis not present

## 2020-10-06 DIAGNOSIS — E1159 Type 2 diabetes mellitus with other circulatory complications: Secondary | ICD-10-CM | POA: Diagnosis not present

## 2020-10-06 DIAGNOSIS — Z23 Encounter for immunization: Secondary | ICD-10-CM | POA: Diagnosis not present

## 2020-10-06 DIAGNOSIS — Z794 Long term (current) use of insulin: Secondary | ICD-10-CM | POA: Diagnosis not present

## 2020-12-04 NOTE — Progress Notes (Signed)
Patient ID: Isaiah GooGlenn A Bonifield Jr., male    DOB: [birth year], 50 y.o.   MRN: [id number]  PCP: Jamelle HaringHendrickson, Joe Gee D, MD  Chief Complaint  Patient presents with  . Follow-up    Subjective:   Isaiah GooGlenn A Messamore Jr. is a 50 y.o. male, presents to clinic with CC of the following:  Chief Complaint  Patient presents with  . Follow-up    HPI:  Patient is a 50 year old male patient. Last visit with me was 06/09/2020 Follow-up communication from lab results at that visit was as follows:  Glucose was slightly high at 143. The remainder of the BMP was good with his kidney function very stable and his electrolytes and calcium level normal Follows up today  All in all, feeling great, he noted trying to get his sugars better controlled with endocrine's help, and encouraged  Diabetes mellitustype 2- Seeing Dr. Gershon Crane'Connell with endocrinology Last visit with endocrine was 10/06/20 with the assessment/plan as follows  Assessment/Plan: 1. Type 2 diabetes. His diabetes is complicated by his history of pancreatitis which has made him insulin-dependent. His A1c today is 8.1. Historically, his libre readings do not match his A1c and fructosamines have suggested better control, but his average today matches his A1c. Based on review of his Josephine Igolibre, I will make no changes to his regimen. I told him to work on bolusing 20-30 minutes before meals. If he gets low after meals, I told him he will need to cut his meal doses as appropriate. I will not use a GLP-1 given his history of pancreatitis.   2. Hypertension/microalbuminuria associated with diabetes. His blood pressure is ok today on 10 mg of lisinopril daily and diltiazem ER 180 mg daily. His most recent microalbumin was negative in 12/20.   3. Hyperlipidemia associated with diabetes. His lipids from 12/20 were good on 80 mg of Lipitor daily with an LDL of 83.   4. Eye disease. He sees the eye doctor every 4 months for glaucoma. He does not have  diabetic retinopathy. We last got a copy of a note from 11/29/2018 Alvester Morin([facility]: [phone]). He says that he was seen in 8/21. I will call and try to obtain a copy of the report.   5. Erectile dysfunction. He uses Viagra as needed. Previously, I sent a prescription to [facility]. He could not get it from them however as he does not have a credit card and they would not take a money order.  6. History of pancreatitis. He had 3 cases of alcoholic pancreatitis, but has not had any since he discontinued alcohol altogether in 2013. I am wary of using a GLP-1 given his history.  7. Foot pain. Sounds like plantar fascitis. I told him to follow-up with his pcp if it persists.   8. Prophylaxis. I did a foot exam on him in 1/21. He says he last saw the eye doctor in 8/21 as above. He was given his flu shot today.   9. He will return to clinic in 4 months.  Recommendations as follows: a1c today was 8.1 but as you recall, it is artificially elevated. Your sugars and your prior secondary glucose testing tells me your a1c is probably around 7 or even 6.9. Let's keep everything the same. When you go to your eye doctors office today, ask them to fax me a copy of your last note (at fax [phone]).   He is currently on metformin 1000 mg twice a day, Tresiba 62 units each  night, Jardiance 25 mg daily, and NovoLog with meals. He takes 14 with breakfast, 12 with lunch and 14 with supper. He does not usually add extra to his dose.  He continues on the freestyle libre. It was downloaded and reviewed last visit wit endo - The average was 157 (175, 158 and 164 the last 3 times). Overall, his download looks good.      A1C 8.1 on 09/2020 visit with endo as noted above Lab Results  Component Value Date   HGBA1C 8.1 04/02/2020   HGBA1C 9.0 10/25/2018   HGBA1C 8.0 02/22/2018   Lab Results  Component Value Date   MICROALBUR 1.3 11/18/2019   LDLCALC 83 11/18/2019   CREATININE 1.09 06/09/2020    Denies:  Polyuria, polydipsia, vision changes, or neuropathy. ACEI/ARB:Yes Statin:Yes   Overweight:  Wt Readings from Last 3 Encounters:  12/10/20 198 lb 3.2 oz (89.9 kg)  06/09/20 195 lb 1.6 oz (88.5 kg)  11/29/19 195 lb (88.5 kg)    Diet - tries to eat healthy Remains active   HTN:  Medication regimen-lisinopril 10mg  and Diltiazem ER 180mg  daily.  Taking medications regularly  He does not check blood pressures at home  BP Readings from Last 3 Encounters:  12/10/20 110/70  06/09/20 118/66  11/29/19 117/84    Denies chest pain, palpitations, shortness of breath, increased BLE edema, increased headaches or vision changes  HLD:  Irritation regimen-atorvastatin 80mg   Takes medication regularly Lab Results  Component Value Date   CHOL 145 11/18/2019   HDL 39 (L) 11/18/2019   LDLCALC 83 11/18/2019   TRIG 124 11/18/2019   CHOLHDL 3.7 11/18/2019   Denies myalgias,   ED: Taking viagra, working well for him.   Tobacco Use: Smoking 1 ppd. Not ready to quit at this time. Cessation strongly encouraged over time  CRC Screening: Denies family or personal history of colorectal cancer,  He had a screening colonoscopy in January 2021, 2 polyps were found, tubular adenomas, and nonbleeding internal hemorrhoids. F/u 5 years.    Prostate CA screening One cousin with prostate CA  Lab Results  Component Value Date   PSA1 0.6 09/02/2015   PSA 0.4 11/18/2019        Patient Active Problem List   Diagnosis Date Noted  . Skin lesion of face 06/09/2020  . Erectile dysfunction 05/16/2018  . History of pancreatitis 08/09/2017  . Hyperlipidemia due to type 2 diabetes mellitus ([place]) 08/09/2017  . Allergic rhinitis 10/25/2016  . Essential hypertension, benign 04/20/2016  . Diabetes mellitus type II, controlled, with no complications ([place]) 67/67/2094  . Glaucoma 04/20/2016  . Tobacco abuse 04/20/2016      Current Outpatient Medications:  .  aspirin EC 81 MG tablet,  Take 1 tablet (81 mg total) by mouth daily., Disp: , Rfl:  .  atorvastatin (LIPITOR) 80 MG tablet, Take 1 tablet (80 mg total) by mouth daily., Disp: 90 tablet, Rfl: 3 .  diltiazem (TIAZAC) 180 MG 24 hr capsule, Take 180 mg by mouth daily. Rx'd by Dr. Honor Junes, Disp: , Rfl:  .  fluticasone (FLONASE) 50 MCG/ACT nasal spray, USE 1 SPRAY IN EACH NOSTRIL 2 TIMES DAILY (Patient taking differently: Place 2 sprays into both nostrils as needed. USE 1 SPRAY IN EACH NOSTRIL 2 TIMES DAILY), Disp: 16 g, Rfl: 12 .  Insulin Glargine 300 UNIT/ML SOPN, Inject 60-66 Units into the skin daily. , Disp: , Rfl:  .  JARDIANCE 25 MG TABS tablet, Take 25 mg by mouth daily., Disp: ,  Rfl: 6 .  lisinopril (PRINIVIL,ZESTRIL) 10 MG tablet, Take 10 mg by mouth daily., Disp: , Rfl: 3 .  metFORMIN (GLUCOPHAGE) 1000 MG tablet, Take 1 tablet by mouth 2 (two) times daily., Disp: , Rfl: 11 .  sildenafil (VIAGRA) 100 MG tablet, Take one tablet 20 minutes or so prior to intimacy best if on empty stomach or light meal, Disp: 4 tablet, Rfl: 5 .  SIMBRINZA 1-0.2 % SUSP, PLACE 1 DROP INTO BOTH EYES TWICE A DAY, Disp: , Rfl: 4   No Known Allergies   Past Surgical History:  Procedure Laterality Date  . COLONOSCOPY WITH PROPOFOL N/A 11/29/2019   Procedure: COLONOSCOPY WITH PROPOFOL;  Surgeon: Jonathon Bellows, MD;  Location[facility] ENDOSCOPY;  Service: Gastroenterology;  Laterality: N/A;  . LESION REMOVAL Left 2017   Forehead     Family History  Problem Relation Age of Onset  . Diabetes Mother   . Emphysema Father   . Diabetes Maternal Grandmother   . Hypertension Maternal Grandmother   . Diabetes Maternal Grandfather   . Hypertension Maternal Grandfather      Social History   Tobacco Use  . Smoking status: Current Every Day Smoker    Packs/day: 1.00    Years: 30.00    Pack years: 30.00    Types: Cigarettes    Start date: 11/21/1985  . Smokeless tobacco: Never Used  . Tobacco comment: patient states that he has been trying.   Substance Use Topics  . Alcohol use: No    Alcohol/week: 0.0 standard drinks    With staff assistance, above reviewed with the patient today.  ROS: As per HPI, otherwise no specific complaints on a limited and focused system review   No results found for this or any previous visit (from the past 72 hour(s)).   PHQ2/9: Depression screen [facility] 2/9 12/10/2020 06/09/2020 11/18/2019 05/20/2019 11/16/2018  Decreased Interest 0 0 0 0 0  Down, Depressed, Hopeless 0 0 0 0 0  PHQ - 2 Score 0 0 0 0 0  Altered sleeping - 0 0 0 0  Tired, decreased energy - 0 0 0 0  Change in appetite - 0 0 0 0  Feeling bad or failure about yourself  - 0 0 0 0  Trouble concentrating - 0 0 0 0  Moving slowly or fidgety/restless - 0 0 0 0  Suicidal thoughts - 0 0 0 0  PHQ-9 Score - 0 0 0 0  Difficult doing work/chores - Not difficult at all Not difficult at all Not difficult at all Not difficult at all   PHQ-2/9 Result reviewed (did not fill out on sheet, and when asked, no concerns)  Fall Risk: Fall Risk  12/10/2020 06/09/2020 11/18/2019 05/20/2019 11/16/2018  Falls in the past year? 0 0 0 0 0  Number falls in past yr: 0 0 0 0 -  Injury with Fall? 0 0 0 0 -  Follow up - - Falls evaluation completed - -      Objective:   Vitals:   12/10/20 0745  BP: 110/70  Pulse: 72  Resp: 16  SpO2: 94%  Weight: 198 lb 3.2 oz (89.9 kg)    Body mass index is 26.15 kg/m.  Physical Exam  Blood pressure checked by myself was 110/70  NAD, masked, very pleasant HEENT - [place]/AT, sclera anicteric, PERRL, EOMI, positive glasses, conj - non-inj'ed, pharynx clear Neck - supple, no adenopathy, no TM, carotids 2+ and = without bruits bilat Car - RRR without m/g/r  Pulm- RR and effort normal at rest, CTA without wheeze or rales Abd - soft, NT diffusely, nondistended Back - no CVA tenderness Neuro/psychiatric - affect was not flat, appropriate with conversation             Alert              Grossly non-focal               Speech and gait are normal   Results for orders placed or performed in visit on 06/09/20  Hemoglobin A1c  Result Value Ref Range   Hemoglobin A1C 8.1   BASIC METABOLIC PANEL WITH GFR  Result Value Ref Range   Glucose, Bld 143 (H) 65 - 99 mg/dL   BUN 16 7 - 25 mg/dL   Creat 1.09 0.70 - 1.33 mg/dL   GFR, Est Non African American 79 > OR = 60 mL/min/1.74m2   GFR, Est African American 91 > OR = 60 mL/min/1.45m2   BUN/Creatinine Ratio NOT APPLICABLE 6 - 22 (calc)   Sodium 140 135 - 146 mmol/L   Potassium 4.6 3.5 - 5.3 mmol/L   Chloride 107 98 - 110 mmol/L   CO2 26 20 - 32 mmol/L   Calcium 9.4 8.6 - 10.3 mg/dL   Last labs reviewed    Assessment & Plan:    1. Controlled type 2 diabetes mellitus without complication, with long-term current use of insulin ([place]) Followed by endocrine, with last visit in November and continue to follow-up with them Continue his current regimen Last A1c was 8.1, trying to get that down some and getting better control of his sugars with endocrine to help  2. Essential hypertension, benign Blood pressure remains controlled with his current medication regimen. will check labs today Continue his current medication regimen.  3. Hyperlipidemia due to type 2 diabetes mellitus ([place]) Last lipid panel in December reviewed, with LDL slightly above goal (83), with goal less than 70. Encouraged dietary modifications and some increase in regular aerobic exercise would be helpful He is on a high-dose statin presently, and tolerating. T/C addition of a medicine like Zetia to help with further control and getting to goal of LDL less than 70 over time  will recheck lipid panel   4. Erectile dysfunction due to arterial insufficiency Can continue the Viagra product as he notes is helpful when takes.  Noted does not use all that frequently  5. Tobacco abuse Encouraged tobacco cessation, although he is not ready to try to quit at this point.  6. Glaucoma,  unspecified glaucoma type, unspecified laterality Continue follow-up with his eye physician. He does have eyedrops to take to help with this presently.  7. Screening for prostate cancer PSAs have been good on recent checks, and discussed checking again today with labs and he wanted to do so    Await lab results, and will schedule follow-up again in approximately 6 months time, sooner as needed, he continues to follow with endocrine for his blood sugars. Can follow-up sooner as needed He is aware that his follow-up with the above lab results and his follow-up visit will be with a new provider as I will no longer be seeing patients at this practice after today.       Towanda Malkin, MD 12/10/20 7:56 AM

## 2020-12-10 ENCOUNTER — Encounter: Payer: Self-pay | Admitting: Internal Medicine

## 2020-12-10 ENCOUNTER — Ambulatory Visit: Payer: BC Managed Care – PPO | Admitting: Internal Medicine

## 2020-12-10 ENCOUNTER — Other Ambulatory Visit: Payer: Self-pay

## 2020-12-10 VITALS — BP 110/70 | HR 72 | Resp 16 | Wt 198.2 lb

## 2020-12-10 DIAGNOSIS — I10 Essential (primary) hypertension: Secondary | ICD-10-CM

## 2020-12-10 DIAGNOSIS — E785 Hyperlipidemia, unspecified: Secondary | ICD-10-CM

## 2020-12-10 DIAGNOSIS — E1169 Type 2 diabetes mellitus with other specified complication: Secondary | ICD-10-CM

## 2020-12-10 DIAGNOSIS — E119 Type 2 diabetes mellitus without complications: Secondary | ICD-10-CM | POA: Diagnosis not present

## 2020-12-10 DIAGNOSIS — Z720 Tobacco use: Secondary | ICD-10-CM

## 2020-12-10 DIAGNOSIS — N5201 Erectile dysfunction due to arterial insufficiency: Secondary | ICD-10-CM | POA: Diagnosis not present

## 2020-12-10 DIAGNOSIS — H409 Unspecified glaucoma: Secondary | ICD-10-CM

## 2020-12-10 DIAGNOSIS — Z794 Long term (current) use of insulin: Secondary | ICD-10-CM

## 2020-12-10 DIAGNOSIS — Z125 Encounter for screening for malignant neoplasm of prostate: Secondary | ICD-10-CM

## 2020-12-11 LAB — COMPLETE METABOLIC PANEL WITH GFR
AG Ratio: 1.5 (calc) (ref 1.0–2.5)
ALT: 10 U/L (ref 9–46)
AST: 15 U/L (ref 10–35)
Albumin: 4.4 g/dL (ref 3.6–5.1)
Alkaline phosphatase (APISO): 62 U/L (ref 35–144)
BUN: 15 mg/dL (ref 7–25)
CO2: 27 mmol/L (ref 20–32)
Calcium: 9.8 mg/dL (ref 8.6–10.3)
Chloride: 105 mmol/L (ref 98–110)
Creat: 1.13 mg/dL (ref 0.70–1.33)
GFR, Est African American: 87 mL/min/{1.73_m2} (ref >=60)
GFR, Est Non African American: 75 mL/min/{1.73_m2} (ref >=60)
Globulin: 3 g/dL (calc) (ref 1.9–3.7)
Glucose, Bld: 78 mg/dL (ref 65–99)
Potassium: 4.6 mmol/L (ref 3.5–5.3)
Sodium: 137 mmol/L (ref 135–146)
Total Bilirubin: 0.6 mg/dL (ref 0.2–1.2)
Total Protein: 7.4 g/dL (ref 6.1–8.1)

## 2020-12-11 LAB — LIPID PANEL
Cholesterol: 108 mg/dL (ref <200)
HDL: 37 mg/dL — ABNORMAL LOW (ref >=40)
LDL Cholesterol (Calc): 56 mg/dL (calc)
Non-HDL Cholesterol (Calc): 71 mg/dL (calc) (ref <130)
Total CHOL/HDL Ratio: 2.9 (calc) (ref <5.0)
Triglycerides: 66 mg/dL (ref <150)

## 2020-12-11 LAB — PSA: PSA: 0.6 ng/mL (ref <=4.0)

## 2021-02-11 DIAGNOSIS — E1169 Type 2 diabetes mellitus with other specified complication: Secondary | ICD-10-CM | POA: Diagnosis not present

## 2021-02-11 DIAGNOSIS — E785 Hyperlipidemia, unspecified: Secondary | ICD-10-CM | POA: Diagnosis not present

## 2021-02-11 DIAGNOSIS — Z794 Long term (current) use of insulin: Secondary | ICD-10-CM | POA: Diagnosis not present

## 2021-02-11 DIAGNOSIS — E1165 Type 2 diabetes mellitus with hyperglycemia: Secondary | ICD-10-CM | POA: Diagnosis not present

## 2021-02-11 DIAGNOSIS — E1159 Type 2 diabetes mellitus with other circulatory complications: Secondary | ICD-10-CM | POA: Diagnosis not present

## 2021-06-17 DIAGNOSIS — E1159 Type 2 diabetes mellitus with other circulatory complications: Secondary | ICD-10-CM | POA: Diagnosis not present

## 2021-06-17 DIAGNOSIS — E1169 Type 2 diabetes mellitus with other specified complication: Secondary | ICD-10-CM | POA: Diagnosis not present

## 2021-06-17 DIAGNOSIS — Z794 Long term (current) use of insulin: Secondary | ICD-10-CM | POA: Diagnosis not present

## 2021-06-17 DIAGNOSIS — E1165 Type 2 diabetes mellitus with hyperglycemia: Secondary | ICD-10-CM | POA: Diagnosis not present

## 2021-07-29 DIAGNOSIS — H40153 Residual stage of open-angle glaucoma, bilateral: Secondary | ICD-10-CM | POA: Diagnosis not present

## 2024-11-22 ENCOUNTER — Other Ambulatory Visit: Payer: Self-pay

## 2024-11-22 ENCOUNTER — Telehealth: Payer: Self-pay

## 2024-11-22 DIAGNOSIS — Z860100 Personal history of colon polyps, unspecified: Secondary | ICD-10-CM

## 2024-11-22 MED ORDER — NA SULFATE-K SULFATE-MG SULF 17.5-3.13-1.6 GM/177ML PO SOLN: 354.0000 mL | Freq: Once | ORAL | 0 refills | Status: AC

## 2024-11-22 NOTE — Telephone Encounter (Signed)
 Gastroenterology Pre-Procedure Review  Request Date: 02/27/2025  Requesting Physician: Dr. Jinny  PATIENT REVIEW QUESTIONS: The patient responded to the following health history questions as indicated:    1. Are you having any GI issues? no 2. Do you have a personal history of Polyps? yes (11/28/2018 Dr. Therisa) 3. Do you have a family history of Colon Cancer or Polyps? no 4. Diabetes Mellitus? yes (Metformin, Jardiance and KJO, listro) 5. Joint replacements in the past 12 months?no 6. Major health problems in the past 3 months?no 7. Any artificial heart valves, MVP, or defibrillator?no    MEDICATIONS & ALLERGIES:    Patient reports the following regarding taking any anticoagulation/antiplatelet therapy:   Plavix, Coumadin, Eliquis, Xarelto, Lovenox, Pradaxa, Brilinta, or Effient? no Aspirin? yes (ASA 81 MG)  Patient confirms/reports the following medications:  Current Outpatient Medications  Medication Sig Dispense Refill   aspirin EC 81 MG tablet Take 1 tablet (81 mg total) by mouth daily.     atorvastatin  (LIPITOR) 80 MG tablet Take 1 tablet (80 mg total) by mouth daily. 90 tablet 3   diltiazem (TIAZAC) 180 MG 24 hr capsule Take 180 mg by mouth daily. Rx'd by Dr. Cherilyn     fluticasone  (FLONASE ) 50 MCG/ACT nasal spray USE 1 SPRAY IN EACH NOSTRIL 2 TIMES DAILY (Patient taking differently: Place 2 sprays into both nostrils as needed. USE 1 SPRAY IN EACH NOSTRIL 2 TIMES DAILY) 16 g 12   Insulin Glargine 300 UNIT/ML SOPN Inject 60-66 Units into the skin daily.      JARDIANCE 25 MG TABS tablet Take 25 mg by mouth daily.  6   lisinopril (PRINIVIL,ZESTRIL) 10 MG tablet Take 10 mg by mouth daily.  3   metFORMIN (GLUCOPHAGE) 1000 MG tablet Take 1 tablet by mouth 2 (two) times daily.  11   sildenafil  (VIAGRA ) 100 MG tablet Take one tablet 20 minutes or so prior to intimacy best if on empty stomach or light meal 4 tablet 5   SIMBRINZA 1-0.2 % SUSP PLACE 1 DROP INTO BOTH EYES TWICE A DAY  4    No current facility-administered medications for this visit.    Patient confirms/reports the following allergies:  Allergies[1]  No orders of the defined types were placed in this encounter.   AUTHORIZATION INFORMATION Primary Insurance: 1D#: Group #:  Secondary Insurance: 1D#: Group #:  SCHEDULE INFORMATION: Date: 02/27/2025 Time: Location: [facility] Dr. Jinny     [1] No Known Allergies

## 2025-02-27 ENCOUNTER — Ambulatory Visit: Admit: 2025-02-27 | Admitting: Gastroenterology

## 2025-02-27 SURGERY — COLONOSCOPY
Anesthesia: General
